# Patient Record
Sex: Female | Born: 1965
Health system: Southern US, Community
[De-identification: ages and names within clinical notes are randomized; demographics above are authoritative.]

## PROBLEM LIST (undated history)

## (undated) DIAGNOSIS — IMO0002 Reserved for concepts with insufficient information to code with codable children: Secondary | ICD-10-CM

## (undated) DIAGNOSIS — E785 Hyperlipidemia, unspecified: Secondary | ICD-10-CM

## (undated) DIAGNOSIS — I10 Essential (primary) hypertension: Secondary | ICD-10-CM

## (undated) DIAGNOSIS — R87619 Unspecified abnormal cytological findings in specimens from cervix uteri: Secondary | ICD-10-CM

## (undated) DIAGNOSIS — D649 Anemia, unspecified: Secondary | ICD-10-CM

## (undated) HISTORY — DX: Anemia, unspecified: D64.9

## (undated) HISTORY — DX: Essential (primary) hypertension: I10

## (undated) HISTORY — DX: Reserved for concepts with insufficient information to code with codable children: IMO0002

## (undated) HISTORY — DX: Hyperlipidemia, unspecified: E78.5

## (undated) HISTORY — PX: BREAST EXCISIONAL BIOPSY: SUR124

## (undated) HISTORY — DX: Unspecified abnormal cytological findings in specimens from cervix uteri: R87.619

---
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
## 2000-01-21 ENCOUNTER — Other Ambulatory Visit: Admission: RE | Admit: 2000-01-21 | Discharge: 2000-01-21 | Payer: Self-pay | Admitting: *Deleted

## 2003-05-02 ENCOUNTER — Ambulatory Visit (HOSPITAL_COMMUNITY): Admission: RE | Admit: 2003-05-02 | Discharge: 2003-05-02 | Payer: Self-pay

## 2003-05-31 ENCOUNTER — Encounter: Admission: RE | Admit: 2003-05-31 | Discharge: 2003-05-31 | Payer: Self-pay | Admitting: Internal Medicine

## 2004-12-09 ENCOUNTER — Ambulatory Visit: Payer: Self-pay | Admitting: Obstetrics and Gynecology

## 2004-12-09 ENCOUNTER — Other Ambulatory Visit: Admission: RE | Admit: 2004-12-09 | Discharge: 2004-12-09 | Payer: Self-pay | Admitting: Obstetrics and Gynecology

## 2004-12-26 ENCOUNTER — Ambulatory Visit: Payer: Self-pay | Admitting: Family Medicine

## 2005-08-20 ENCOUNTER — Ambulatory Visit: Payer: Self-pay | Admitting: Obstetrics and Gynecology

## 2005-08-20 ENCOUNTER — Encounter: Payer: Self-pay | Admitting: Obstetrics and Gynecology

## 2006-03-11 ENCOUNTER — Ambulatory Visit: Payer: Self-pay | Admitting: Obstetrics and Gynecology

## 2006-03-11 ENCOUNTER — Encounter: Payer: Self-pay | Admitting: Obstetrics and Gynecology

## 2006-09-25 ENCOUNTER — Emergency Department (HOSPITAL_COMMUNITY): Admission: EM | Admit: 2006-09-25 | Discharge: 2006-09-25 | Payer: Self-pay | Admitting: Family Medicine

## 2008-11-29 ENCOUNTER — Observation Stay (HOSPITAL_COMMUNITY): Admission: EM | Admit: 2008-11-29 | Discharge: 2008-11-29 | Payer: Self-pay | Admitting: Emergency Medicine
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
## 2010-11-04 NOTE — Op Note (Signed)
NAME:  Autumn Gomez, Autumn Gomez            ACCOUNT NO.:  [id number]   MEDICAL RECORD NO.:  [id number]          PATIENT TYPE:  OBV   LOCATION:  5037                         FACILITY:  [facility]   PHYSICIAN:  Artist Pais. Weingold, M.D.DATE OF BIRTH:  Oct 27, 1965   DATE OF PROCEDURE:  11/29/2008  DATE OF DISCHARGE:                               OPERATIVE REPORT   PREOPERATIVE DIAGNOSES:  Crush injury, left index and left long finger  with significant open injuries, distal interphalangeal level amputation  index finger, open distal interphalangeal fracture long finger.   POSTOPERATIVE DIAGNOSES:  Crush injury, left index and left long finger  with significant open injuries, distal interphalangeal level amputation  index finger, open distal interphalangeal fracture long finger.   PROCEDURE:  1. Irrigation and debridement of above with revision amputation and      volar V-Y flap with distal interphalangeal level amputation left      index finger.  2. Open treatment of distal interphalangeal fracture, nailbed      laceration, complex laceration left long finger.   SURGEON:  Artist Pais. Mina Marble, MD   ASSISTANT:  None.   ANESTHESIA:  General.   TOURNIQUET TIME:  38 minutes.   COMPLICATIONS:  No complications.   DRAINS:  No drains.   OPERATIVE REPORT:  The patient was taken to the operating suite.  After  induction of adequate general anesthesia, the left upper extremity was  prepped and draped in sterile fashion.  An Esmarch was used to  exsanguinate the limb, tourniquet was then inflated to 250 mmHg.  At  this point in time, the index finger and long finger were approached at  the distal aspects, left side.  There was a DIP level amputation of the  index finger with small remnant of distal interphalangeal bone still  attached to the profundus tendon.  Long finger had a complex open injury  to the distal phalanx with nailbed laceration, distal interphalangeal  fracture, etc.  The wounds were  thoroughly irrigated and debrided.  The  long finger was repaired using 4-0 Vicryl Rapide for the nailbed  distally and also the skin complex laceration and treatment of the open  distal phalangeal fracture.  Attention was then paid to the index finger  with a small remnant of distal interphalangeal bone was excised off the  head of the middle phalanx.  At this point in time, bilateral  neurectomies were performed and a volar V-Y flap was raised and advanced  over the tip of the middle phalanx covering the bone.  This was also  sutured with 4-0 Vicryl Rapide, 2 small dog ears were excised, the  Vicryl Rapide was also used to align the ends of the excised dog ears.  At this point in time, the wounds were  irrigated.  Plain Marcaine 0.25% was injected into the flexor sheath of  both the index and long finger, left side for postop pain control and  they were both placed in sterile dressing of Xeroform, 4x4s, and Coban  wraps.  The patient tolerated the procedure well and went to recovery in  stable fashion.  Artist Pais Mina Marble, M.D.  Electronically Signed     MAW/MEDQ  D:  11/29/2008  T:  11/29/2008  Job:  [id number]

## 2011-06-23 HISTORY — PX: OTHER SURGICAL HISTORY: SHX169

## 2011-10-20 ENCOUNTER — Other Ambulatory Visit: Payer: Self-pay | Admitting: Obstetrics and Gynecology

## 2011-10-20 ENCOUNTER — Encounter: Payer: Self-pay | Admitting: *Deleted

## 2011-10-20 ENCOUNTER — Ambulatory Visit (INDEPENDENT_AMBULATORY_CARE_PROVIDER_SITE_OTHER): Payer: Self-pay | Admitting: *Deleted

## 2011-10-20 VITALS — BP 146/91 | HR 64 | Temp 98.7°F | Ht 66.75 in | Wt 193.4 lb

## 2011-10-20 DIAGNOSIS — Z1239 Encounter for other screening for malignant neoplasm of breast: Secondary | ICD-10-CM

## 2011-10-20 DIAGNOSIS — N6322 Unspecified lump in the left breast, upper inner quadrant: Secondary | ICD-10-CM

## 2011-10-20 NOTE — Patient Instructions (Signed)
Taught patient how to perform BSE and gave educational materials to take home. Patient did not need a Pap smear today due to last Pap smear was in 08/17/11. Let her know BCCCP will cover Pap smears every 3 years unless has a history of abnormal Pap smears. Patient referred to the [facility] for diagnostic mammogram. Appointment scheduled for Oct 22, 2011 at 1400. Patient aware of appointment and will be there. Let patient know will follow up with her within the next couple weeks with results. Patient verbalized understanding.

## 2011-10-20 NOTE — Progress Notes (Signed)
Patient referred from the free breast and cervical cancer screening on 08/17/11 due to an area of concern on left breast. Last mammogram was in 2004.  Pap Smear:    Pap smear not performed today. Patients last Pap smear was 08/17/11 and normal. Patient has a history of two abnormal Pap smears that required colposcopies. Last abnormal Pap smear was in 2010 per patient. Pap smear result above is in EPIC under media.  Physical exam: Breasts Breasts symmetrical. No skin abnormalities right breast. Scar on left breast at area of concern from history of cyst removal. No nipple retraction bilateral breasts. No nipple discharge bilateral breasts. No lymphadenopathy. No lumps palpated right breast. Palpated thickened area at 11 o'clock left breast next to areola where had previous cyst removal. No complaints of pain or tenderness on exam. Patient referred to the [facility] for diagnostic mammogram. Appointment scheduled for Oct 22, 2011 at 1400.        Pelvic/Bimanual No Pap smear completed today since last Pap smear was 32/25/13 and normal. Pap smear not indicated per BCCCP guidelines.

## 2011-10-22 ENCOUNTER — Ambulatory Visit
Admission: RE | Admit: 2011-10-22 | Discharge: 2011-10-22 | Disposition: A | Discharge status: Home / Self Care | Payer: No Typology Code available for payment source | Source: Ambulatory Visit | Attending: Obstetrics and Gynecology | Admitting: Obstetrics and Gynecology

## 2011-10-22 DIAGNOSIS — N6322 Unspecified lump in the left breast, upper inner quadrant: Secondary | ICD-10-CM

## 2011-10-27 ENCOUNTER — Telehealth: Payer: Self-pay

## 2011-10-27 NOTE — Telephone Encounter (Signed)
Called pt and pt informed me that she was aware that she is to continue with annual mammograms and that the results were benign.  Pt stated that she was pleased about how everything was taken care of.  Pt had no further questions.

## 2012-04-18 ENCOUNTER — Emergency Department (INDEPENDENT_AMBULATORY_CARE_PROVIDER_SITE_OTHER)
Admission: EM | Admit: 2012-04-18 | Discharge: 2012-04-18 | Disposition: A | Discharge status: Home / Self Care | Payer: Self-pay | Source: Home / Self Care | Attending: Emergency Medicine | Admitting: Emergency Medicine

## 2012-04-18 ENCOUNTER — Encounter (HOSPITAL_COMMUNITY): Payer: Self-pay

## 2012-04-18 DIAGNOSIS — S139XXA Sprain of joints and ligaments of unspecified parts of neck, initial encounter: Secondary | ICD-10-CM

## 2012-04-18 DIAGNOSIS — S239XXA Sprain of unspecified parts of thorax, initial encounter: Secondary | ICD-10-CM

## 2012-04-18 DIAGNOSIS — S161XXA Strain of muscle, fascia and tendon at neck level, initial encounter: Secondary | ICD-10-CM

## 2012-04-18 MED ORDER — CYCLOBENZAPRINE HCL 10 MG PO TABS: 10.0000 mg | ORAL_TABLET | Freq: Two times a day (BID) | ORAL | Status: DC | PRN

## 2012-04-18 MED ORDER — NAPROXEN 500 MG PO TABS: 500.0000 mg | ORAL_TABLET | Freq: Two times a day (BID) | ORAL | Status: DC
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
## 2012-04-18 NOTE — ED Notes (Signed)
Patient complains of back pain between her shoulders and some cold sx, mostly sneezing  , all started Saturday

## 2012-04-19 NOTE — ED Provider Notes (Signed)
Medical screening examination/treatment/procedure(s) were performed by non-physician practitioner and as supervising physician I was immediately available for consultation/collaboration.  Leslee Home, M.D.   Reuben Likes, MD 04/19/12 1101

## 2012-09-27 ENCOUNTER — Other Ambulatory Visit: Payer: Self-pay

## 2013-01-26 ENCOUNTER — Emergency Department (HOSPITAL_COMMUNITY)
Admission: EM | Admit: 2013-01-26 | Discharge: 2013-01-26 | Disposition: A | Discharge status: Home / Self Care | Payer: BC Managed Care – PPO | Source: Home / Self Care | Attending: Emergency Medicine | Admitting: Emergency Medicine

## 2013-01-26 DIAGNOSIS — J020 Streptococcal pharyngitis: Secondary | ICD-10-CM

## 2013-01-26 LAB — POCT RAPID STREP A: Streptococcus, Group A Screen (Direct): POSITIVE — AB

## 2013-01-26 MED ORDER — PENICILLIN G BENZATHINE 1200000 UNIT/2ML IM SUSP
1.2000 10*6.[IU] | Freq: Once | INTRAMUSCULAR | Status: AC
  Administered 2013-01-26: 1.2 10*6.[IU] via INTRAMUSCULAR

## 2013-01-26 MED ORDER — PENICILLIN G BENZATHINE 1200000 UNIT/2ML IM SUSP
INTRAMUSCULAR | Status: AC
  Filled 2013-01-26: qty 2

## 2013-01-26 NOTE — ED Notes (Signed)
Patient aware of post injection delay prior to discharge.   

## 2013-01-26 NOTE — ED Notes (Signed)
Patient not ready for discharge 

## 2013-01-26 NOTE — ED Notes (Signed)
Offered ice chips and sprite.

## 2013-01-26 NOTE — ED Provider Notes (Signed)
Chief Complaint:  No chief complaint on file.   History of Present Illness:   Autumn Gomez is a 47 year old female who presents with a two-day history of sore throat on the right, temperature of up to 101.1, chills, headache, myalgias, and aching in her lymph nodes. She has been exposed to strep through a family member. She also works in childcare. She denies any nasal congestion, rhinorrhea, cough, or GI symptoms.  Review of Systems:  Other than as noted above, the patient denies any of the following symptoms. Systemic:  No fever, chills, sweats, fatigue, myalgias, headache, or anorexia. Eye:  No redness, pain or drainage. ENT:  No earache, ear congestion, nasal congestion, sneezing, rhinorrhea, sinus pressure, sinus pain, or post nasal drip. Lungs:  No cough, sputum production, wheezing, shortness of breath, or chest pain. GI:  No abdominal pain, nausea, vomiting, or diarrhea. Skin:  No rash or itching.  PMFSH:  Past medical history, family history, social history, meds, allergies, and nurse's notes were reviewed.  There is no known exposure to strep or mono.  No prior history of step or mono.  The patient denies use of tobacco. She is allergic to cephalexin. She is on her menses right now.  Physical Exam:   Vital signs:  BP 166/91  Pulse 95  Temp(Src) 101.1 F (38.4 C) (Oral)  Resp 20  SpO2 94% General:  Alert, in no distress. Eye:  No conjunctival injection or drainage. Lids were normal. ENT:  TMs and canals were normal, without erythema or inflammation.  Nasal mucosa was clear and uncongested, without drainage.  Mucous membranes were moist.  Exam of pharynx tonsils were enlarged, red, with spots of white exudate.  There were no oral ulcerations or lesions. Neck:  Supple, no adenopathy, tenderness or mass. Lungs:  No respiratory distress.  Lungs were clear to auscultation, without wheezes, rales or rhonchi.  Breath sounds were clear and equal bilaterally.  Heart:  Regular rhythm,  without gallops, murmers or rubs. Skin:  Clear, warm, and dry, without rash or lesions.  Labs:   Results for orders placed during the hospital encounter of 01/26/13  POCT RAPID STREP A ([facility] ONLY)      Result Value Range   Streptococcus, Group A Screen (Direct) POSITIVE (*) NEGATIVE    Course in Urgent Care Center:   Given LA Bicillin 1.2 million units IM.  Assessment:  The encounter diagnosis was Strep throat.  No evidence of peritonsillar abscess.  Plan:   1.  The following meds were prescribed:   Discharge Medication List as of 01/26/2013 12:56 PM     2.  The patient was instructed in symptomatic care including hot saline gargles, throat lozenges, infectious precautions, and need to trade out toothbrush. Handouts were given. 3.  The patient was told to return if becoming worse in any way, if no better in 3 or 4 days, and given some red flag symptoms such as difficulty swallowing or breathing that would indicate earlier return. 4.  Follow up here as needed.    Reuben Likes, MD 01/26/13 [phone]

## 2013-04-20 IMAGING — MG MM DIGITAL DIAGNOSTIC BILAT
5 series · 5 of 5 positions shown · non-contrast
Comparison: None.

CLINICAL DATA: Patient presents for a bilateral diagnostic
mammogram due to a palpable abnormality over the inner midportion
of the left breast.

DIGITAL DIAGNOSTIC BILATERAL MAMMOGRAM WITH CAD AND LEFT BREAST
ULTRASOUND:

[L CC (1 of 3)]
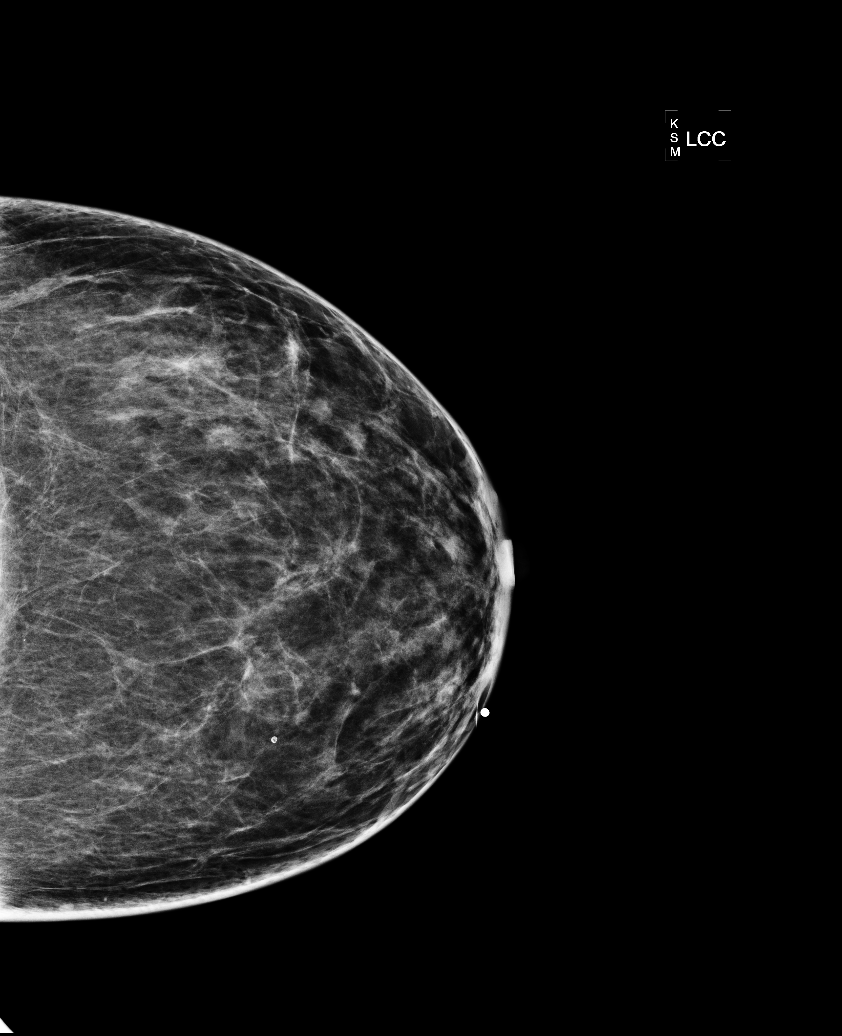

[L MLO]
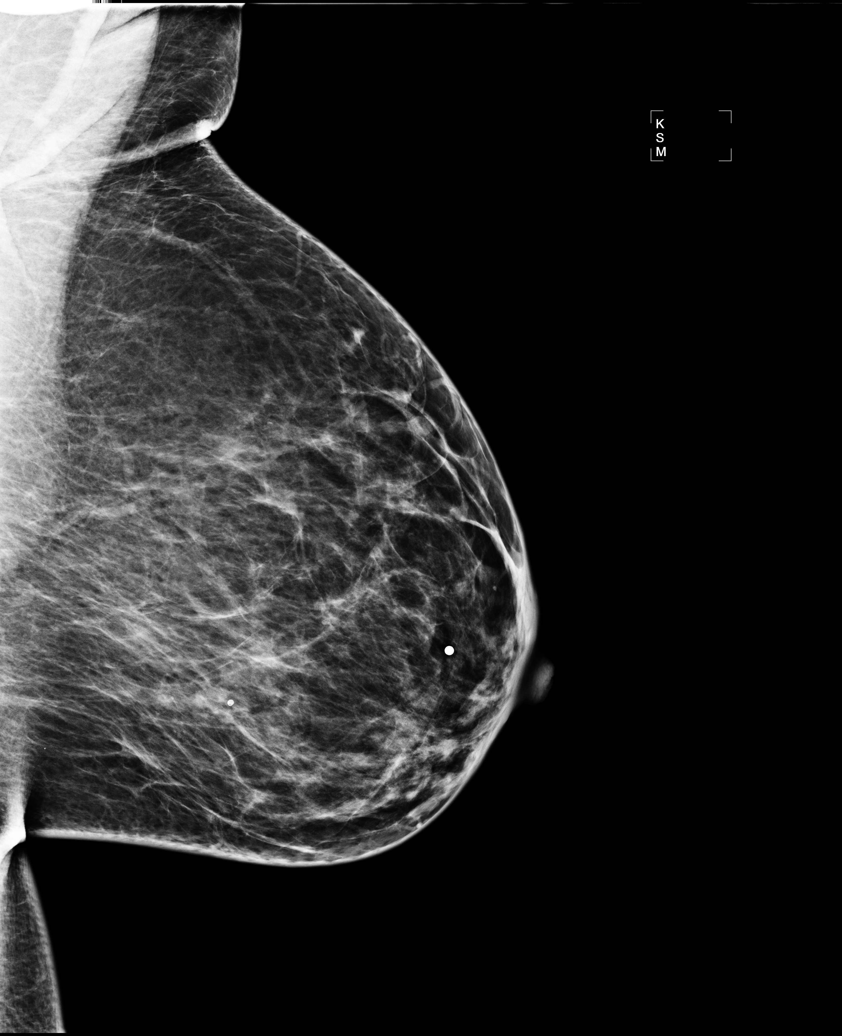

[R MLO]
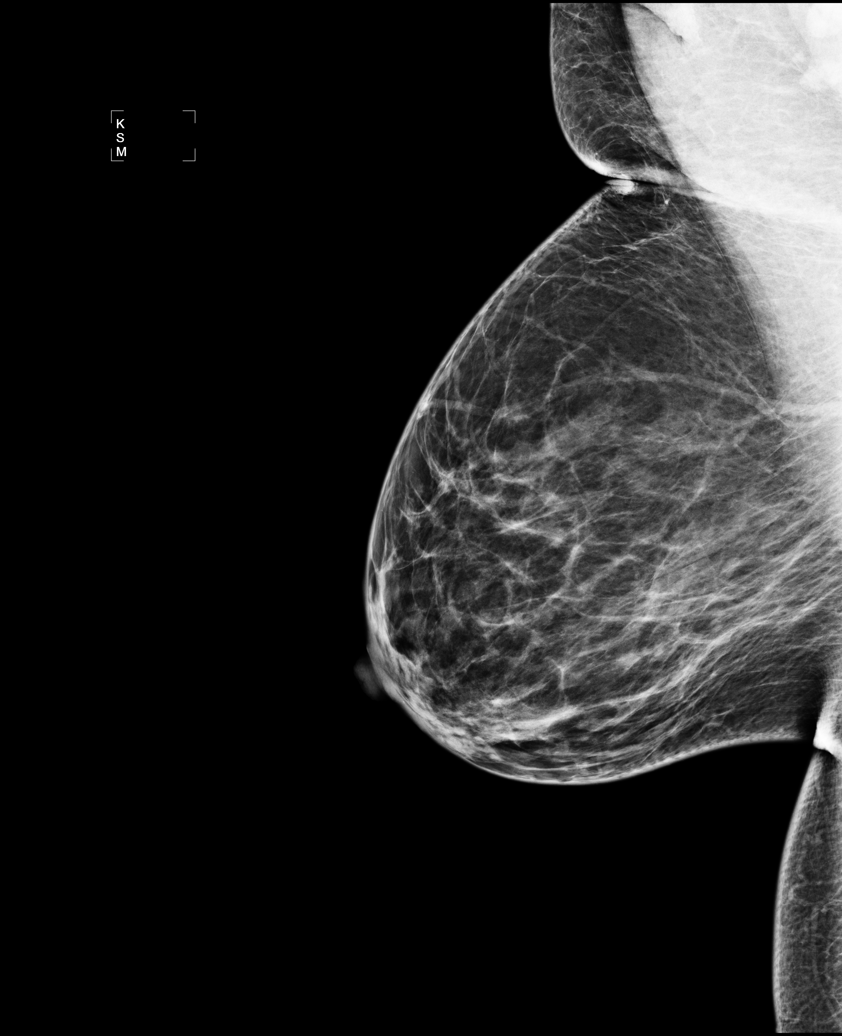

[L CC (2 of 3)]
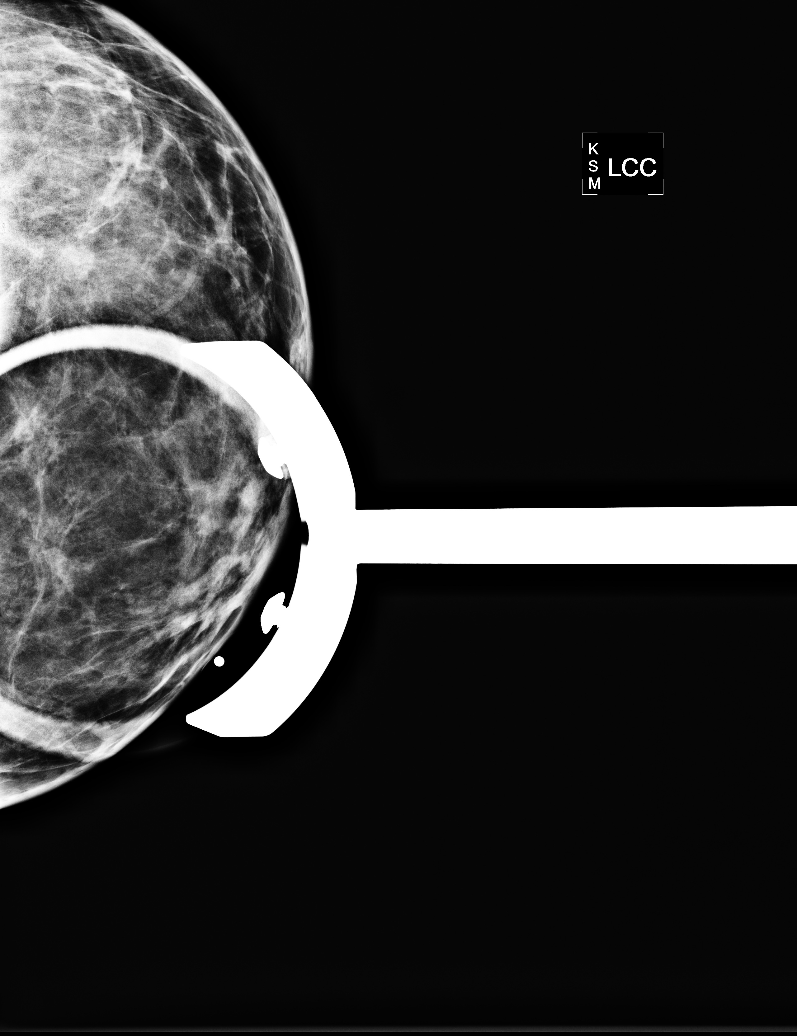

[L CC (3 of 3)]
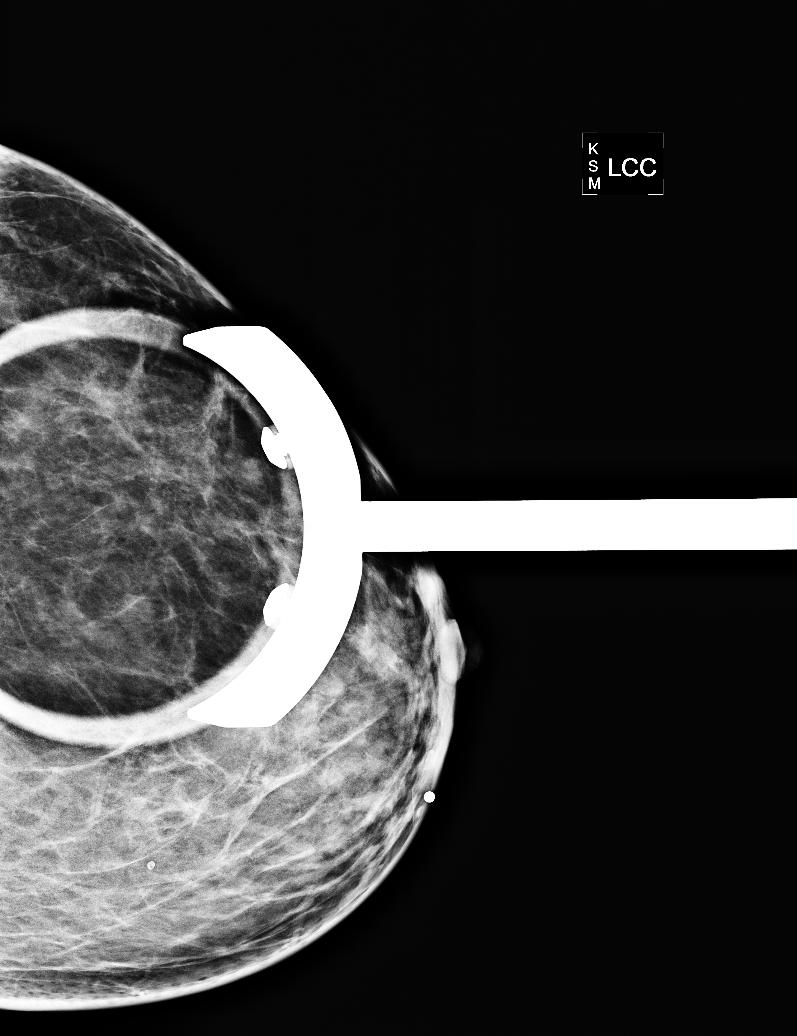

[5 of 5 positions shown; findings below may reference images not displayed]

FINDINGS: Examination demonstrates scattered fibroglandular
densities.  There is no focal abnormality over the inner midportion
of the left breast to correspond to patient's palpable abnormality.
There is a small focal density over the outer left breast which
does not persist upon additional imaging.  Remainder of the exam is
unremarkable.
Mammographic images were processed with CAD.

On physical exam, I palpate no definite focal abnormality of the
inner midportion of the left breast in the area of patient's
clinical abnormality.

Ultrasound is performed, showing no focal abnormality of the inner
midportion of the left breast over the area of patient's palpable
abnormality.

Ultrasound of the upper outer left breast demonstrates a
incidentally noted benign cyst cluster.
IMPRESSION: No focal abnormality over the inner midportion of the left breast
to correspond to patient's palpable abnormality.

Recommendations:  Recommend continued annual bilateral screening
mammographic follow-up.  Also recommend continued management of
patient's palpable abnormality on a clinical basis.

BI-RADS CATEGORY 2:  Benign finding(s).

## 2013-04-30 ENCOUNTER — Emergency Department (HOSPITAL_COMMUNITY): Payer: Self-pay

## 2013-04-30 ENCOUNTER — Encounter (HOSPITAL_COMMUNITY): Payer: Self-pay | Admitting: Emergency Medicine

## 2013-04-30 ENCOUNTER — Emergency Department (HOSPITAL_COMMUNITY)
Admission: EM | Admit: 2013-04-30 | Discharge: 2013-04-30 | Disposition: A | Discharge status: Home / Self Care | Payer: Self-pay | Attending: Emergency Medicine | Admitting: Emergency Medicine

## 2013-04-30 DIAGNOSIS — Y9301 Activity, walking, marching and hiking: Secondary | ICD-10-CM | POA: Insufficient documentation

## 2013-04-30 DIAGNOSIS — Y92410 Unspecified street and highway as the place of occurrence of the external cause: Secondary | ICD-10-CM | POA: Insufficient documentation

## 2013-04-30 DIAGNOSIS — Z862 Personal history of diseases of the blood and blood-forming organs and certain disorders involving the immune mechanism: Secondary | ICD-10-CM | POA: Insufficient documentation

## 2013-04-30 DIAGNOSIS — F17200 Nicotine dependence, unspecified, uncomplicated: Secondary | ICD-10-CM | POA: Insufficient documentation

## 2013-04-30 DIAGNOSIS — S9032XA Contusion of left foot, initial encounter: Secondary | ICD-10-CM

## 2013-04-30 DIAGNOSIS — Z79899 Other long term (current) drug therapy: Secondary | ICD-10-CM | POA: Insufficient documentation

## 2013-04-30 DIAGNOSIS — IMO0002 Reserved for concepts with insufficient information to code with codable children: Secondary | ICD-10-CM | POA: Insufficient documentation

## 2013-04-30 DIAGNOSIS — S9030XA Contusion of unspecified foot, initial encounter: Secondary | ICD-10-CM | POA: Insufficient documentation

## 2013-04-30 MED ORDER — IBUPROFEN 800 MG PO TABS: 800.0000 mg | ORAL_TABLET | Freq: Three times a day (TID) | ORAL | Status: DC | PRN
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
## 2013-04-30 NOTE — ED Provider Notes (Signed)
Medical screening examination/treatment/procedure(s) were performed by non-physician practitioner and as supervising physician I was immediately available for consultation/collaboration.  EKG Interpretation   None         Semya Klinke M Ariza Evans, MD 04/30/13 1404 

## 2013-08-23 ENCOUNTER — Telehealth (HOSPITAL_COMMUNITY): Payer: Self-pay | Admitting: *Deleted

## 2013-08-23 NOTE — Telephone Encounter (Signed)
Telephoned patient at home #. Patient is no longer eligible for the BCCCP program due to having insurance. Gave patient # to [place] to call and schedule appointment. Patient does not have primary care Dr. Patient voiced understanding.

## 2013-08-24 ENCOUNTER — Other Ambulatory Visit: Payer: Self-pay

## 2013-08-24 DIAGNOSIS — Z1231 Encounter for screening mammogram for malignant neoplasm of breast: Secondary | ICD-10-CM

## 2013-09-12 ENCOUNTER — Ambulatory Visit
Admission: RE | Admit: 2013-09-12 | Discharge: 2013-09-12 | Disposition: A | Discharge status: Home / Self Care | Payer: No Typology Code available for payment source | Source: Ambulatory Visit

## 2013-09-12 DIAGNOSIS — Z1231 Encounter for screening mammogram for malignant neoplasm of breast: Secondary | ICD-10-CM

## 2013-10-09 ENCOUNTER — Ambulatory Visit (INDEPENDENT_AMBULATORY_CARE_PROVIDER_SITE_OTHER): Payer: No Typology Code available for payment source | Admitting: Nurse Practitioner

## 2013-10-09 ENCOUNTER — Encounter: Payer: Self-pay | Admitting: Nurse Practitioner

## 2013-10-09 VITALS — BP 136/87 | HR 84 | Temp 98.0°F | Ht 65.0 in | Wt 213.8 lb

## 2013-10-09 DIAGNOSIS — D219 Benign neoplasm of connective and other soft tissue, unspecified: Secondary | ICD-10-CM

## 2013-10-09 DIAGNOSIS — F17200 Nicotine dependence, unspecified, uncomplicated: Secondary | ICD-10-CM | POA: Insufficient documentation

## 2013-10-09 DIAGNOSIS — D259 Leiomyoma of uterus, unspecified: Secondary | ICD-10-CM

## 2013-10-09 DIAGNOSIS — E669 Obesity, unspecified: Secondary | ICD-10-CM | POA: Insufficient documentation

## 2013-10-09 DIAGNOSIS — Z01419 Encounter for gynecological examination (general) (routine) without abnormal findings: Secondary | ICD-10-CM

## 2013-10-09 DIAGNOSIS — Z8742 Personal history of other diseases of the female genital tract: Secondary | ICD-10-CM | POA: Insufficient documentation

## 2013-10-09 NOTE — Progress Notes (Signed)
History:  Autumn Rutherfordis a 48 y.o. [id number] who presents to [facility] clinic today for pap smear and well woman exam. She has been having her paps with [facility] and is a little unsure of her results. She had her last pap in 2013. She has had 2 colposcopies in past and both were resulted as normal. She is currently not sexually active. Has been active mostly with women in past. No males in past 5 years. If she had sex with female she will use condoms. Her last mammogram was 2 weeks ago and she has never had an abnormal. Her menses are heavy the last few months. She has regular menses monthly but 3 days are heavy, 3 are medium and one minimal bleeding day.  The following portions of the patient's history were reviewed and updated as appropriate: allergies, current medications, past family history, past medical history, past social history, past surgical history and problem list.  Review of Systems:  Pertinent items are noted in HPI.  Objective:  Physical Exam BP 136/87  Pulse 84  Temp(Src) 98 F (36.7 C) (Oral)  Ht 5\' 5"  (1.651 m)  Wt 213 lb 12.8 oz (96.979 kg)  BMI 35.58 kg/m2  LMP 09/25/2013 GENERAL: Well-developed, well-nourished female in no acute distress. Obese HEENT: Normocephalic, atraumatic.  NECK: Supple. Normal thyroid.  LUNGS: Normal rate. Clear to auscultation bilaterally.  HEART: Regular rate and rhythm with no adventitious sounds.  BREASTS: Symmetric in size. No masses, skin changes, nipple drainage, or lymphadenopathy. Some skin issues under arms could be eczema ABDOMEN: Soft, nontender, nondistended. No organomegaly. Normal bowel sounds appreciated in all quadrants.  PELVIC: Normal external female genitalia. Vagina is pink and rugated.  Normal discharge. Normal cervix contour. Pap smear obtained. Uterus is full feeling and possible fibroid. No adnexal mass or tenderness.  EXTREMITIES: No cyanosis, clubbing, or edema, 2+ distal pulses.   Labs and Imaging Mm Digital Screening  Bilateral  09/13/2013   CLINICAL DATA:  Screening.  EXAM: DIGITAL SCREENING BILATERAL MAMMOGRAM WITH CAD  COMPARISON:  Previous exam(s).  ACR Breast Density Category b: There are scattered areas of fibroglandular density.  FINDINGS: There are no findings suspicious for malignancy. Images were processed with CAD.  IMPRESSION: No mammographic evidence of malignancy. A result letter of this screening mammogram will be mailed directly to the patient.  RECOMMENDATION: Screening mammogram in one year. (Code:SM-B-01Y)  BI-RADS CATEGORY  1: Negative.   Electronically Signed   By: Enrique Sack M.D.   On: 09/13/2013 11:19    Assessment & Plan:  Assessment:  Well Woman Exam Uterine Fibroids Obesity Smoker  Plans:  Pap today U/S for r/o fibroid Advised to loose weight and quit smoke  Olegario Messier, NP 10/09/2013 2:27 PM

## 2013-10-09 NOTE — Patient Instructions (Signed)
Fibroids Fibroids are lumps (tumors) that can occur any place in a woman's body. These lumps are not cancerous. Fibroids vary in size, weight, and where they grow. HOME CARE  Do not take aspirin.  Write down the number of pads or tampons you use during your period. Tell your doctor. This can help determine the best treatment for you. GET HELP RIGHT AWAY IF:  You have pain in your lower belly (abdomen) that is not helped with medicine.  You have cramps that are not helped with medicine.  You have more bleeding between or during your period.  You feel lightheaded or pass out (faint).  Your lower belly pain gets worse. MAKE SURE YOU:  Understand these instructions.  Will watch your condition.  Will get help right away if you are not doing well or get worse. Document Released: 07/11/2010 Document Revised: 08/31/2011 Document Reviewed: 07/11/2010 [employer] Patient Information 2014 [employer].  

## 2013-10-18 ENCOUNTER — Ambulatory Visit (HOSPITAL_COMMUNITY)
Admission: RE | Admit: 2013-10-18 | Discharge: 2013-10-18 | Disposition: A | Discharge status: Home / Self Care | Payer: No Typology Code available for payment source | Source: Ambulatory Visit | Attending: Nurse Practitioner | Admitting: Nurse Practitioner

## 2013-10-18 DIAGNOSIS — D252 Subserosal leiomyoma of uterus: Secondary | ICD-10-CM | POA: Insufficient documentation

## 2013-10-18 DIAGNOSIS — D251 Intramural leiomyoma of uterus: Secondary | ICD-10-CM | POA: Insufficient documentation

## 2013-10-18 DIAGNOSIS — N852 Hypertrophy of uterus: Secondary | ICD-10-CM | POA: Insufficient documentation

## 2013-10-18 DIAGNOSIS — D219 Benign neoplasm of connective and other soft tissue, unspecified: Secondary | ICD-10-CM

## 2013-10-23 ENCOUNTER — Telehealth: Payer: Self-pay | Admitting: *Deleted

## 2013-10-23 NOTE — Telephone Encounter (Signed)
Message copied by Sue Lush on Mon Oct 23, 2013  3:44 PM ------      Message from: BAREFOOT, LINDA M      Created: Mon Oct 23, 2013  1:06 PM       Can you let patient know that I reviewed Ultrasound with Dr Roselie Awkward and she has fibroids. Thanks, Vaughan Basta ------

## 2013-10-23 NOTE — Telephone Encounter (Signed)
Contacted patient, gave results, pt will schedule follow up appointment. No further questions.

## 2013-10-26 ENCOUNTER — Telehealth: Payer: Self-pay

## 2013-10-26 NOTE — Telephone Encounter (Signed)
Pt. Called stating she would like to know results/where her fibroid is located or if it will be discussed at follow up appointment. Spoke to Dr. Harolyn Wain regarding results-- multiple fibroids, possible discussion of surgery at follow up but nothing that needs to be done immediately. Called pt. And informed her that the ultrasound did show multiple fibroids but that the results will be discussed in detail at her follow up appointment and a plan of care will be developed. Pt. Asked if surgery would occur. Informed pt. That they may suggest surgery but that it would not need to happen right away. Pt. Stated she wants options. Informed pt. The providers will discuss options and results at follow up appointment. Pt. Verbalized understanding and gratitude. No further questions or concerns.

## 2013-12-06 ENCOUNTER — Ambulatory Visit (INDEPENDENT_AMBULATORY_CARE_PROVIDER_SITE_OTHER): Payer: No Typology Code available for payment source | Admitting: Obstetrics and Gynecology

## 2013-12-06 DIAGNOSIS — Z712 Person consulting for explanation of examination or test findings: Secondary | ICD-10-CM

## 2013-12-06 DIAGNOSIS — D259 Leiomyoma of uterus, unspecified: Secondary | ICD-10-CM

## 2013-12-06 DIAGNOSIS — Z7189 Other specified counseling: Secondary | ICD-10-CM

## 2013-12-06 NOTE — Progress Notes (Signed)
Patient ID: Autumn Gomez, female   DOB: Feb 01, 1966, 48 y.o.   MRN: [id number] 48 yo here for results of April pelvic ultrasound. Patient is doing well and is without complaints.   09/2013 ultrasound FINDINGS:  Uterus: 14.5 x 13.6 x 11.2 cm. Markedly heterogeneous uterine  echotexture. Well-defined lesion in the right side of the uterine  body measures 5.7 x 6.4 x 6.0 cm. A left-sided fundal lesion is  intramural versus subserosal and measures 6.7 x 4.1 x 4.6 cm.  Endometrium: Poorly evaluated secondary to the extent of presumed  fibroids. Measures maximally 12 mm.  Right Ovary: 4.6 x 2.9 x 3.0 cm. Dominant follicle within.  Hyperechoic deep right ovarian lesion is suspicious for a dermoid.  2.2 x 1.3 x 1.9 cm, including on image 41.  A heterogeneous "Mass" positioned medial and posterior to the right  ovary. Measure 6.4 x 5.6 x 4.6 cm. Example image 54.  Left Ovary: Not visualized.  Other Findings: Small volume free pelvic fluid. There is right-sided  adnexal fluid. Localized adnexal fluid versus hydrosalpinx within  the left hemipelvis. Example image 46.  IMPRESSION:  1. Markedly heterogeneous uterine echotexture and enlargement.  Presumably related to uterine fibroids.  2. Hyperechoic right ovarian lesion, likely a dermoid.  3. "Mass" medial to the right ovary, which could represent an  exophytic right sided uterine fibroid. Suboptimally evaluated.  4. Localize left adnexal fluid versus hydrosalpinx. Pre and post  contrast pelvic MRI may be useful for further characterization of  the multiple above findings.  Electronically Signed  By: Abigail Miyamoto M.D.  On: 10/18/2013 10:34  A/P 48 yo with fibroid uterus - Results were reviewed with the patient - No intervention needed at this time as patient is asymptomatic and patient is not interested in treatment options either - RTC in April for annual exam or prn

## 2014-11-05 ENCOUNTER — Ambulatory Visit: Payer: No Typology Code available for payment source | Admitting: Family Medicine

## 2014-11-09 ENCOUNTER — Other Ambulatory Visit: Payer: Self-pay | Admitting: Obstetrics and Gynecology

## 2014-11-09 DIAGNOSIS — Z1231 Encounter for screening mammogram for malignant neoplasm of breast: Secondary | ICD-10-CM

## 2014-11-29 ENCOUNTER — Ambulatory Visit (HOSPITAL_COMMUNITY)
Admission: RE | Admit: 2014-11-29 | Discharge: 2014-11-29 | Disposition: A | Discharge status: Home / Self Care | Payer: Self-pay | Source: Ambulatory Visit | Attending: Obstetrics and Gynecology | Admitting: Obstetrics and Gynecology

## 2014-11-29 ENCOUNTER — Encounter (HOSPITAL_COMMUNITY): Payer: Self-pay

## 2014-11-29 VITALS — BP 164/100 | Temp 98.7°F | Ht 65.5 in | Wt 198.0 lb

## 2014-11-29 DIAGNOSIS — Z1239 Encounter for other screening for malignant neoplasm of breast: Secondary | ICD-10-CM

## 2014-11-29 DIAGNOSIS — Z1231 Encounter for screening mammogram for malignant neoplasm of breast: Secondary | ICD-10-CM

## 2014-11-29 NOTE — Progress Notes (Signed)
CLINIC:  [facility] Clinic  REASON FOR VISIT: Well-woman exam and screening mammogram.    HISTORY OF PRESENT ILLNESS:  Autumn Gomez is a 49 y.o. female who presents to the [facility] today for clinical breast exam. She has a history of benign breast biopsies x 2 at age 32 and age 34 in the left breast.  Both biopsies were at the same 10 o'clock location of the left breast and revealed cysts.  She later went on to have the cysts excised, as they could not drained, per patient report.  Her family history of breast cancer includes a maternal aunt. No other family history of breast cancer.  She has no complaints today.  Her last pap smear with HPV co-testing was in in 09/2013 and were both negative. She has a history of abnormal pap smears in the past requiring 2 colposcopies. Last recorded abnormal pap was 2010.    REVIEW OF SYSTEMS:  Denies any breast pain, nodularity, nipple inversion, or nipple discharge bilaterally.   ALLERGIES: Allergies  Allergen Reactions  . Keflex [Cephalexin]     CURRENT MEDICATIONS:  Current Outpatient Prescriptions on File Prior to Encounter  Medication Sig Dispense Refill  . cyclobenzaprine (FLEXERIL) 10 MG tablet Take 1 tablet (10 mg total) by mouth 2 (two) times daily as needed for muscle spasms. (Patient not taking: Reported on 11/29/2014) 20 tablet 0  . ibuprofen (ADVIL,MOTRIN) 800 MG tablet Take 1 tablet (800 mg total) by mouth every 8 (eight) hours as needed. (Patient not taking: Reported on 11/29/2014) 21 tablet 0   No current facility-administered medications on file prior to encounter.     SOCIAL HISTORY:  Autumn Gomez is single and does not have any children.  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     PHYSICAL EXAM:  Vitals:  Filed Vitals:   11/29/14 1016  BP: 164/100  Temp: 98.7 F (37.1 C)   General: Well-nourished,  well-appearing female in no acute distress.  She is unaccompanied in clinic today.  Rolena Infante, LPN was present during physical exam for this patient.  Breasts: Bilateral breasts exposed and observed with patient standing (arms at side, arms on hips, arms on hips flexed forward, and arms over head).  Left breast healed scar noted at 10 o'clock position, abutting the nipple. No other gross abnormalities including breast skin puckering or dimpling noted on observation.  Left breast is larger than right.  Breast skin is without evidence of skin redness, thickening, or peau d'orange appearance. No nipple retraction or nipple discharge noted bilaterally.  No breast nodularity palpated in bilateral breasts.  Normal fibrocystic breast changes noted in bilateral breasts. Axillary lymph nodes: No axillary lymphadenopathy bilaterally.   GU: Exam deferred. Pap smear is up-to-date.  ASSESSMENT & PLAN:   1. Breast cancer screening: Autumn Gomez has no palpable breast abnormalities on her clinical breast exam today.  She will receive her screening mammogram as scheduled.  She will be contracted by the imaging center for results of the mammogram, either by letter or phone within the next few weeks.  She was given instructions and educational materials regarding breast self-awareness. Autumn Gomez is aware of this plan and agrees with it.    Autumn Gomez was encouraged to ask questions and all questions were answered to her satisfaction.    Mike Craze, NP [place]  [phone]

## 2014-12-12 ENCOUNTER — Encounter: Payer: Self-pay | Admitting: Family Medicine

## 2014-12-12 ENCOUNTER — Ambulatory Visit: Payer: Self-pay | Attending: Family Medicine | Admitting: Family Medicine

## 2014-12-12 VITALS — BP 159/97 | HR 77 | Temp 99.2°F | Resp 16 | Ht 65.25 in | Wt 199.0 lb

## 2014-12-12 DIAGNOSIS — I10 Essential (primary) hypertension: Secondary | ICD-10-CM | POA: Insufficient documentation

## 2014-12-12 DIAGNOSIS — Z114 Encounter for screening for human immunodeficiency virus [HIV]: Secondary | ICD-10-CM | POA: Insufficient documentation

## 2014-12-12 DIAGNOSIS — D649 Anemia, unspecified: Secondary | ICD-10-CM

## 2014-12-12 DIAGNOSIS — B354 Tinea corporis: Secondary | ICD-10-CM | POA: Insufficient documentation

## 2014-12-12 DIAGNOSIS — J302 Other seasonal allergic rhinitis: Secondary | ICD-10-CM

## 2014-12-12 DIAGNOSIS — E559 Vitamin D deficiency, unspecified: Secondary | ICD-10-CM

## 2014-12-12 DIAGNOSIS — Z23 Encounter for immunization: Secondary | ICD-10-CM

## 2014-12-12 DIAGNOSIS — J309 Allergic rhinitis, unspecified: Secondary | ICD-10-CM | POA: Insufficient documentation

## 2014-12-12 DIAGNOSIS — Z0000 Encounter for general adult medical examination without abnormal findings: Secondary | ICD-10-CM

## 2014-12-12 LAB — COMPLETE METABOLIC PANEL WITH GFR
ALT: 15 U/L (ref 0–35)
AST: 25 U/L (ref 0–37)
Albumin: 4.1 g/dL (ref 3.5–5.2)
Alkaline Phosphatase: 56 U/L (ref 39–117)
BUN: 14 mg/dL (ref 6–23)
CO2: 25 mEq/L (ref 19–32)
Calcium: 9.2 mg/dL (ref 8.4–10.5)
Chloride: 106 mEq/L (ref 96–112)
Creat: 0.93 mg/dL (ref 0.50–1.10)
GFR, Est African American: 84 mL/min
GFR, Est Non African American: 73 mL/min
Glucose, Bld: 91 mg/dL (ref 70–99)
Potassium: 4.5 mEq/L (ref 3.5–5.3)
Sodium: 139 mEq/L (ref 135–145)
Total Bilirubin: 0.3 mg/dL (ref 0.2–1.2)
Total Protein: 6.8 g/dL (ref 6.0–8.3)

## 2014-12-12 LAB — CBC
HCT: 31.4 % — ABNORMAL LOW (ref 36.0–46.0)
Hemoglobin: 10.1 g/dL — ABNORMAL LOW (ref 12.0–15.0)
MCH: 28.9 pg (ref 26.0–34.0)
MCHC: 32.2 g/dL (ref 30.0–36.0)
MCV: 90 fL (ref 78.0–100.0)
MPV: 12.3 fL (ref 8.6–12.4)
Platelets: 304 10*3/uL (ref 150–400)
RBC: 3.49 MIL/uL — ABNORMAL LOW (ref 3.87–5.11)
RDW: 15.3 % (ref 11.5–15.5)
WBC: 7.6 10*3/uL (ref 4.0–10.5)

## 2014-12-12 LAB — LIPID PANEL
Cholesterol: 199 mg/dL (ref 0–200)
HDL: 74 mg/dL (ref >=46)
LDL Cholesterol: 99 mg/dL (ref 0–99)
Total CHOL/HDL Ratio: 2.7 Ratio
Triglycerides: 130 mg/dL (ref <150)
VLDL: 26 mg/dL (ref 0–40)

## 2014-12-12 MED ORDER — CETIRIZINE HCL 10 MG PO TABS: 10.0000 mg | ORAL_TABLET | Freq: Every day | ORAL | Status: DC

## 2014-12-12 MED ORDER — KETOCONAZOLE 2 % EX CREA: 1.0000 "application " | TOPICAL_CREAM | Freq: Every day | CUTANEOUS | Status: DC

## 2014-12-12 NOTE — Progress Notes (Signed)
   Subjective:    Patient ID: Autumn Gomez, female    DOB: February 05, 1966, 49 y.o.   MRN: [id number] CC:  Establish care, elevated BP  HPI  1. Hypertension Patient is here for evaluation of elevated blood pressures. Age at onset of elevated blood pressure:  45. Cardiac symptoms: palpitations. Patient denies chest pain, dyspnea and lower extremity edema. Cardiovascular risk factors: none. Use of agents associated with hypertension: decongestants and NSAIDS. History of target organ damage: none.  2. Skin rash: R upper abdomen. Mild itching. Gets sweaty with work. Had similar rash in R upper chest that resolved.   3. Allergies: taking OTC decongestant and nasal steroid, gets sinus pressure, sneezingm runny nose.  Med Hx: anemia Surg Hx: L finger amputation Fam Hx: HTN in mother and father   Review of Systems  Constitutional: Negative for fever, chills and unexpected weight change.  HENT: Positive for sinus pressure and sneezing.   Eyes: Positive for visual disturbance.  Respiratory: Positive for cough. Negative for chest tightness and shortness of breath.   Cardiovascular: Positive for palpitations. Negative for chest pain and leg swelling.  Musculoskeletal: Positive for neck pain.  Skin: Positive for rash.       R upper abdomen, red splotch   Allergic/Immunologic: Positive for environmental allergies. Negative for food allergies and immunocompromised state.  Neurological: Positive for headaches. Negative for dizziness, tremors, weakness and light-headedness.  Psychiatric/Behavioral: Negative for dysphoric mood. The patient is not nervous/anxious.       Objective:   Physical Exam BP 159/97 mmHg  Pulse 77  Temp(Src) 99.2 F (37.3 C) (Oral)  Resp 16  Ht 5' 5.25" (1.657 m)  Wt 199 lb (90.266 kg)  BMI 32.88 kg/m2  SpO2 100%  LMP 11/22/2014 (Exact Date)  BP Readings from Last 3 Encounters:  11/29/14 164/100  10/09/13 136/87  04/30/13 139/99  General appearance: alert,  cooperative and no distress Head: Normocephalic, without obvious abnormality, atraumatic Eyes: conjunctivae/corneas clear. PERRL, EOM's intact.  Nose; swollen pink nasal turbinates b/l  Neck: no adenopathy, supple, symmetrical, trachea midline and thyroid not enlarged, symmetric, no tenderness/mass/nodules Lungs: clear to auscultation bilaterally Heart: regular rate and rhythm, S1, S2 normal, no murmur, click, rub or gallop Abdomen: soft, non-tender; bowel sounds normal; no masses,  no organomegaly Extremities: extremities normal, atraumatic, no cyanosis or edema Pulses: 2+ and symmetric Skin: Skin color, texture, turgor normal. No rashes or lesions except semicircular hyperpigmented ring on R upper abdomen  Neurologic: Grossly normal     Assessment & Plan:

## 2014-12-12 NOTE — Assessment & Plan Note (Signed)
1. Hypertension: Goal BP < 140/90  How to get to goal: Regular exercise  Low salt diet Medication-thiazide diuretics, hydrochlorothiazide or chlorthalidone

## 2014-12-12 NOTE — Assessment & Plan Note (Signed)
Allergies: zyrtec 10 mg once daily.

## 2014-12-12 NOTE — Progress Notes (Signed)
Establish Care Stated was told had elevated BP 174/120 No Hx tobacco

## 2014-12-12 NOTE — Assessment & Plan Note (Signed)
   Skin rash: superficial fungus, change shirt to stay dry. Ketoconazole cream.

## 2014-12-12 NOTE — Assessment & Plan Note (Addendum)
Screening HIV ordered  Screening HIV negative  

## 2014-12-12 NOTE — Patient Instructions (Addendum)
Autumn Gomez,  Thank you for coming in today. It was a pleasure meeting you. I look forward to being your primary doctor.  1. Hypertension: Goal BP < 140/90  How to get to goal: Regular exercise  Low salt diet Medication-thiazide diuretics, hydrochlorothiazide or chlorthalidone   2. Skin rash: superficial fungus, change shirt to stay dry. Ketoconazole cream.  3. Allergies: zyrtec 10 mg once daily.   F/u in 3-4 weeks with me for hypertension   Dr. Adrian Blackwater   DASH Eating Plan DASH stands for "Dietary Approaches to Stop Hypertension." The DASH eating plan is a healthy eating plan that has been shown to reduce high blood pressure (hypertension). Additional health benefits may include reducing the risk of type 2 diabetes mellitus, heart disease, and stroke. The DASH eating plan may also help with weight loss. WHAT DO I NEED TO KNOW ABOUT THE DASH EATING PLAN? For the DASH eating plan, you will follow these general guidelines:  Choose foods with a percent daily value for sodium of less than 5% (as listed on the food label).  Use salt-free seasonings or herbs instead of table salt or sea salt.  Check with your health care provider or pharmacist before using salt substitutes.  Eat lower-sodium products, often labeled as "lower sodium" or "no salt added."  Eat fresh foods.  Eat more vegetables, fruits, and low-fat dairy products.  Choose whole grains. Look for the word "whole" as the first word in the ingredient list.  Choose fish and skinless chicken or [place] more often than red meat. Limit fish, poultry, and meat to 6 oz (170 g) each day.  Limit sweets, desserts, sugars, and sugary drinks.  Choose heart-healthy fats.  Limit cheese to 1 oz (28 g) per day.  Eat more home-cooked food and less restaurant, buffet, and fast food.  Limit fried foods.  Cook foods using methods other than frying.  Limit canned vegetables. If you do use them, rinse them well to decrease the  sodium.  When eating at a restaurant, ask that your food be prepared with less salt, or no salt if possible. WHAT FOODS CAN I EAT? Seek help from a dietitian for individual calorie needs. Grains Whole grain or whole wheat bread. Brown rice. Whole grain or whole wheat pasta. Quinoa, bulgur, and whole grain cereals. Low-sodium cereals. Corn or whole wheat flour tortillas. Whole grain cornbread. Whole grain crackers. Low-sodium crackers. Vegetables Fresh or frozen vegetables (raw, steamed, roasted, or grilled). Low-sodium or reduced-sodium tomato and vegetable juices. Low-sodium or reduced-sodium tomato sauce and paste. Low-sodium or reduced-sodium canned vegetables.  Fruits All fresh, canned (in natural juice), or frozen fruits. Meat and Other Protein Products Ground beef (85% or leaner), grass-fed beef, or beef trimmed of fat. Skinless chicken or [place]. Ground chicken or [place]. Pork trimmed of fat. All fish and seafood. Eggs. Dried beans, peas, or lentils. Unsalted nuts and seeds. Unsalted canned beans. Dairy Low-fat dairy products, such as skim or 1% milk, 2% or reduced-fat cheeses, low-fat ricotta or cottage cheese, or plain low-fat yogurt. Low-sodium or reduced-sodium cheeses. Fats and Oils Tub margarines without trans fats. Light or reduced-fat mayonnaise and salad dressings (reduced sodium). Avocado. Safflower, olive, or canola oils. Natural peanut or almond butter. Other Unsalted popcorn and pretzels. The items listed above may not be a complete list of recommended foods or beverages. Contact your dietitian for more options. WHAT FOODS ARE NOT RECOMMENDED? Grains White bread. White pasta. White rice. Refined cornbread. Bagels and croissants. Crackers that contain trans  fat. Vegetables Creamed or fried vegetables. Vegetables in a cheese sauce. Regular canned vegetables. Regular canned tomato sauce and paste. Regular tomato and vegetable juices. Fruits Dried fruits. Canned fruit in  light or heavy syrup. Fruit juice. Meat and Other Protein Products Fatty cuts of meat. Ribs, chicken wings, bacon, sausage, bologna, salami, chitterlings, fatback, hot dogs, bratwurst, and packaged luncheon meats. Salted nuts and seeds. Canned beans with salt. Dairy Whole or 2% milk, cream, half-and-half, and cream cheese. Whole-fat or sweetened yogurt. Full-fat cheeses or blue cheese. Nondairy creamers and whipped toppings. Processed cheese, cheese spreads, or cheese curds. Condiments Onion and garlic salt, seasoned salt, table salt, and sea salt. Canned and packaged gravies. Worcestershire sauce. Tartar sauce. Barbecue sauce. Teriyaki sauce. Soy sauce, including reduced sodium. Steak sauce. Fish sauce. Oyster sauce. Cocktail sauce. Horseradish. Ketchup and mustard. Meat flavorings and tenderizers. Bouillon cubes. Hot sauce. Tabasco sauce. Marinades. Taco seasonings. Relishes. Fats and Oils Butter, stick margarine, lard, shortening, ghee, and bacon fat. Coconut, palm kernel, or palm oils. Regular salad dressings. Other Pickles and olives. Salted popcorn and pretzels. The items listed above may not be a complete list of foods and beverages to avoid. Contact your dietitian for more information. WHERE CAN I FIND MORE INFORMATION? National Heart, Lung, and Blood Institute: travelstabloid.com Document Released: 05/28/2011 Document Revised: 10/23/2013 Document Reviewed: 04/12/2013 [facility] Patient Information 2015 [place], [place]. This information is not intended to replace advice given to you by your health care provider. Make sure you discuss any questions you have with your health care provider.

## 2014-12-13 LAB — HIV ANTIBODY (ROUTINE TESTING W REFLEX): HIV 1&2 Ab, 4th Generation: NONREACTIVE

## 2014-12-13 LAB — VITAMIN D 25 HYDROXY (VIT D DEFICIENCY, FRACTURES): Vit D, 25-Hydroxy: 7 ng/mL — ABNORMAL LOW (ref 30–100)

## 2014-12-17 DIAGNOSIS — Z0000 Encounter for general adult medical examination without abnormal findings: Secondary | ICD-10-CM | POA: Insufficient documentation

## 2014-12-17 DIAGNOSIS — D649 Anemia, unspecified: Secondary | ICD-10-CM | POA: Insufficient documentation

## 2014-12-17 DIAGNOSIS — E559 Vitamin D deficiency, unspecified: Secondary | ICD-10-CM | POA: Insufficient documentation

## 2014-12-17 MED ORDER — VITAMIN D3 50 MCG (2000 UT) PO TABS: 2000.0000 [IU] | ORAL_TABLET | Freq: Every day | ORAL | Status: DC

## 2014-12-17 MED ORDER — VITAMIN D (ERGOCALCIFEROL) 1.25 MG (50000 UNIT) PO CAPS: 50000.0000 [IU] | ORAL_CAPSULE | ORAL | Status: DC

## 2014-12-17 NOTE — Assessment & Plan Note (Signed)
A: vit D def P: 50 K U D3 x 12 weeks Followed by 2 K U D3 daily

## 2014-12-17 NOTE — Addendum Note (Signed)
Addended by: Boykin Nearing on: 12/17/2014 02:21 PM   Modules accepted: Orders

## 2014-12-25 ENCOUNTER — Telehealth: Payer: Self-pay | Admitting: *Deleted

## 2014-12-25 NOTE — Telephone Encounter (Signed)
LVM to return call.

## 2014-12-25 NOTE — Telephone Encounter (Signed)
-----   Message from Boykin Nearing, MD sent at 12/17/2014  2:19 PM EDT ----- Mild anemia, GI referral for screening colonoscopy  Vit D deficiency will treat Screening HIV negative CMP normal

## 2014-12-26 ENCOUNTER — Encounter: Payer: Self-pay | Admitting: *Deleted

## 2014-12-26 NOTE — Telephone Encounter (Signed)
Unable to contact Pt  Voice mail full

## 2015-01-09 NOTE — Telephone Encounter (Signed)
Telephoned patient at home # and left message to return call.

## 2015-04-16 IMAGING — US US PELVIS COMPLETE
1 series · 13 of 25 positions shown · non-contrast
Comparison: None

CLINICAL DATA: Enlarged uterus.  Uterine fibroids.

EXAM:
TRANSABDOMINAL AND TRANSVAGINAL ULTRASOUND OF PELVIS
TECHNIQUE: Both transabdominal and transvaginal ultrasound examinations of the
pelvis were performed. Transabdominal technique was performed for
global imaging of the pelvis including uterus, ovaries, adnexal
regions, and pelvic cul-de-sac. It was necessary to proceed with
endovaginal exam following the transabdominal exam to visualize the
uterus, ovaries, and adnexa.

[Series 1: us pelvis complete · 54 acquisitions, 13 frames shown]
[im 1/54]
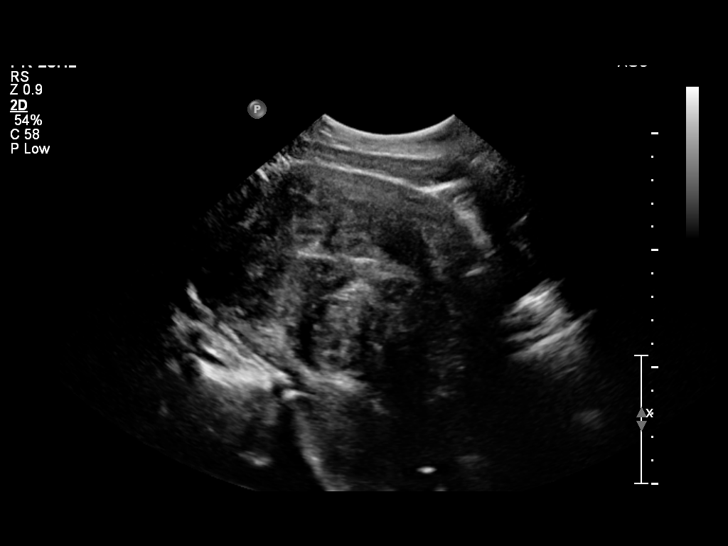
[im 5/54]
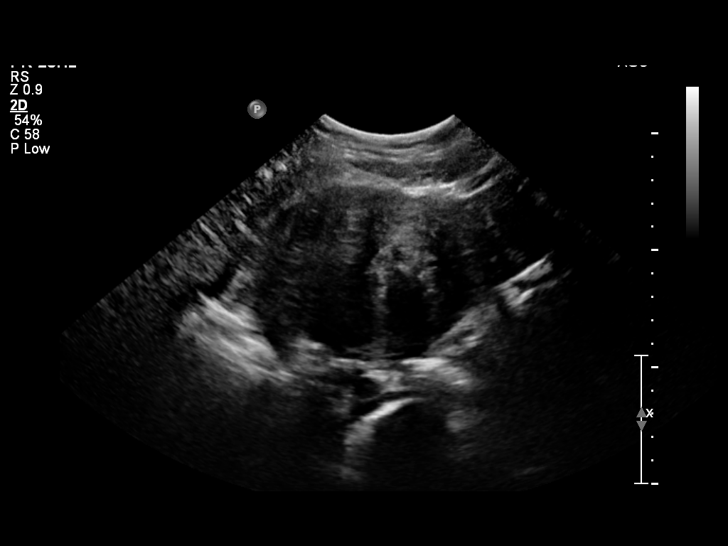
[im 9/54]
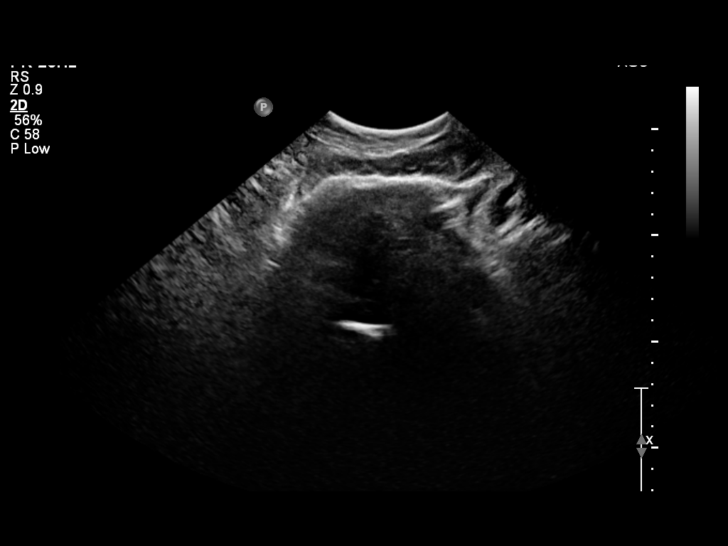
[im 14/54]
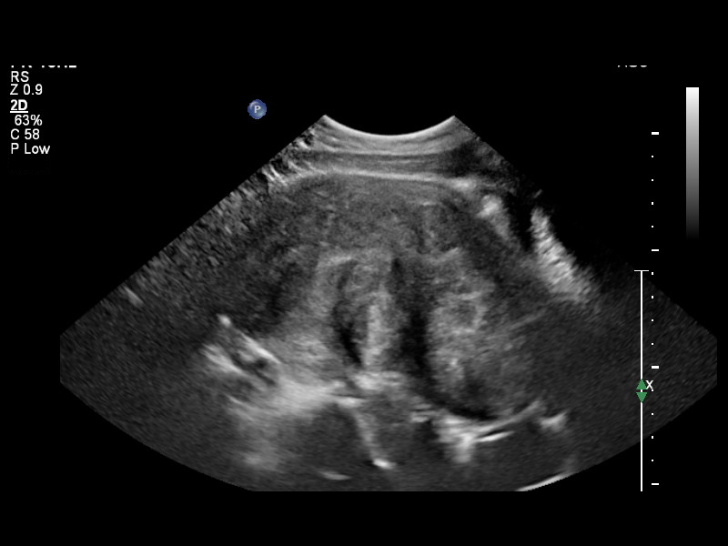
[im 18/54]
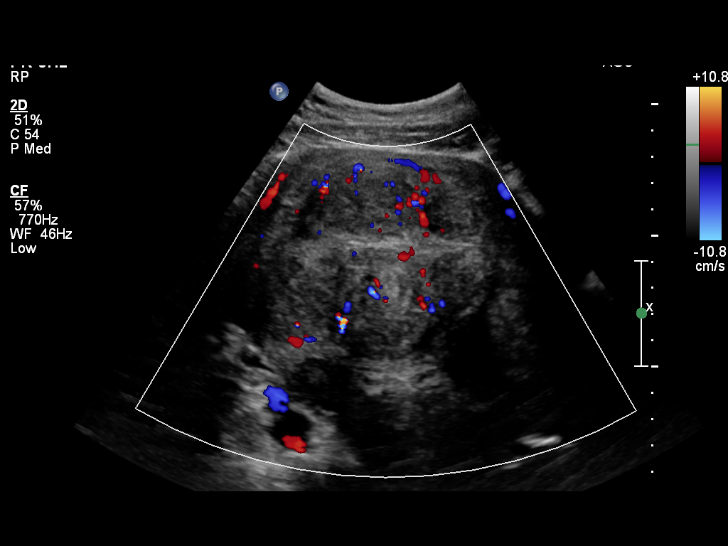
[im 23/54]
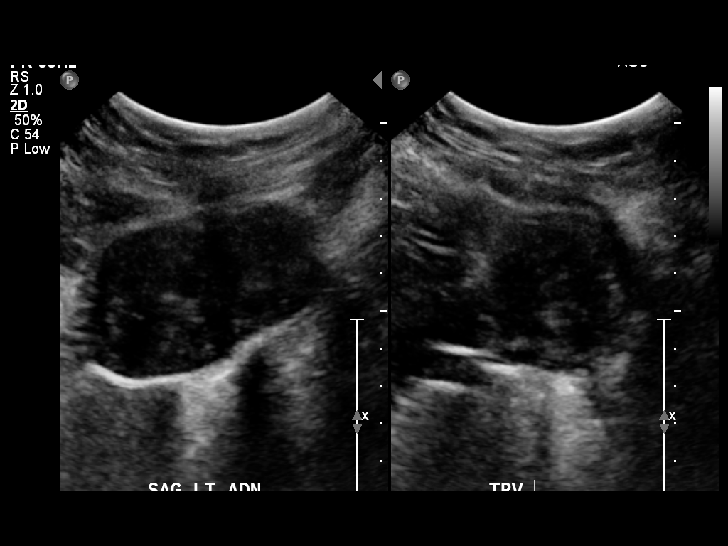
[im 27/54]
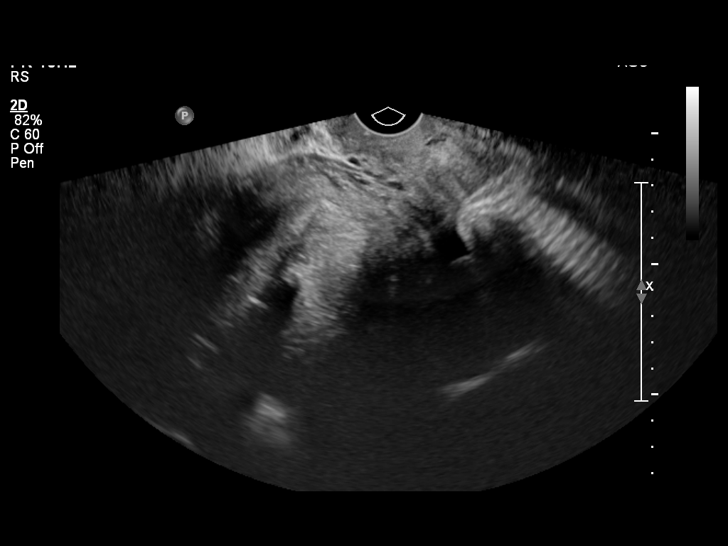
[im 31/54]
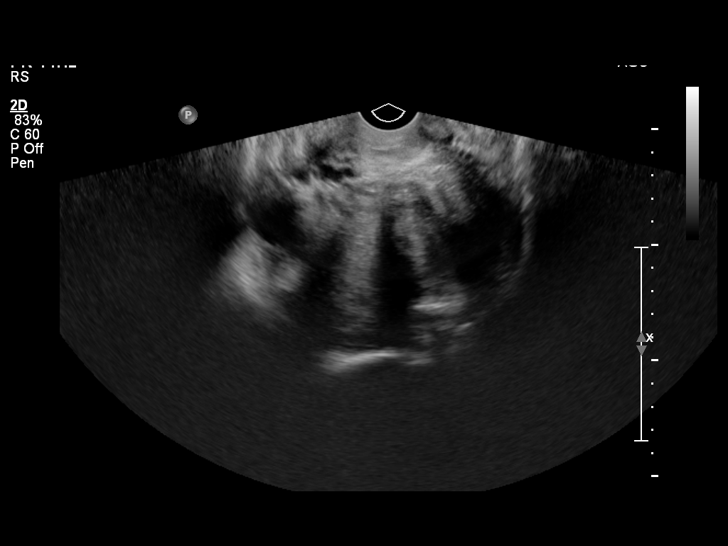
[im 36/54]
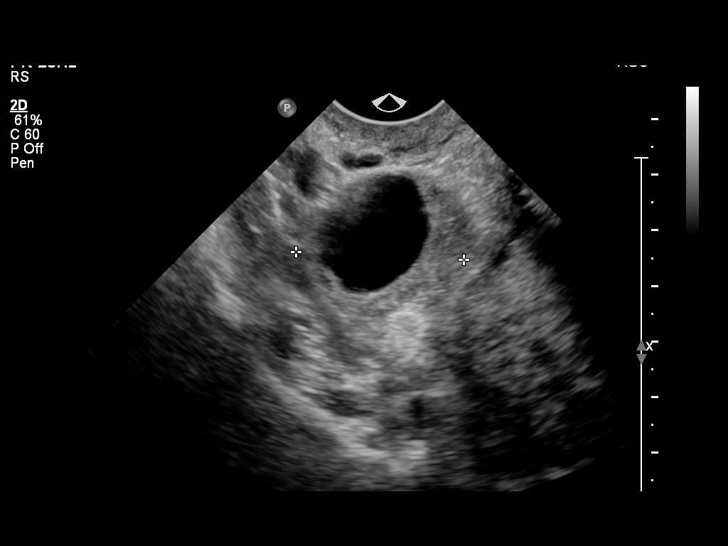
[im 40/54]
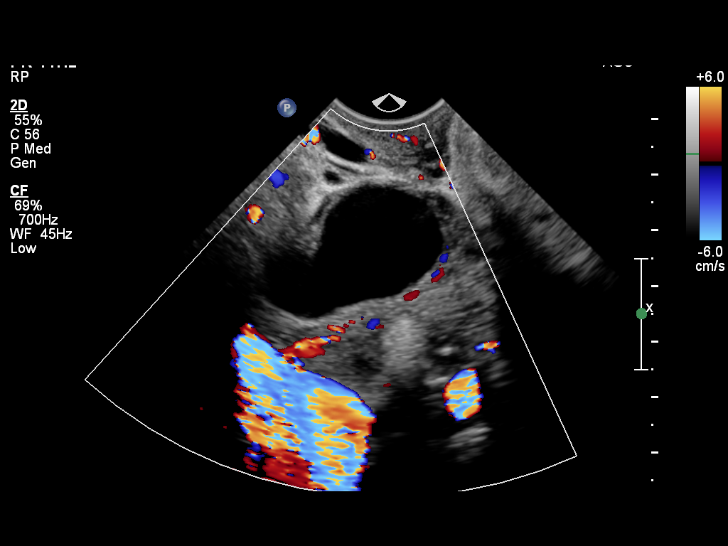
[im 45/54]
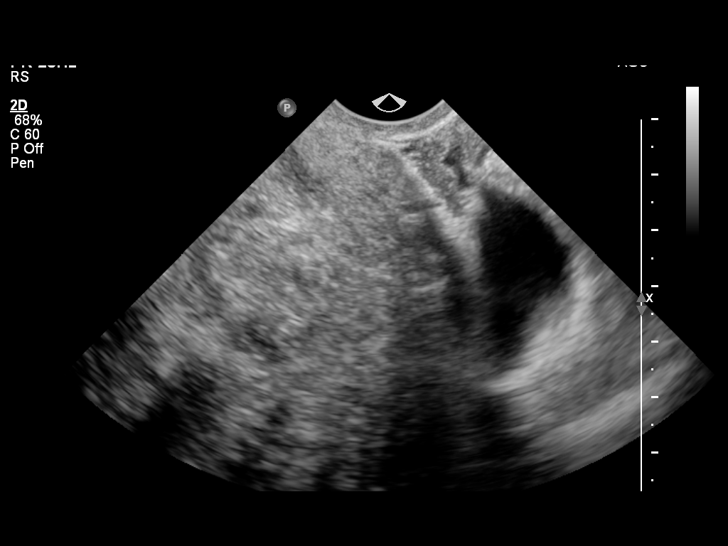
[im 49/54]
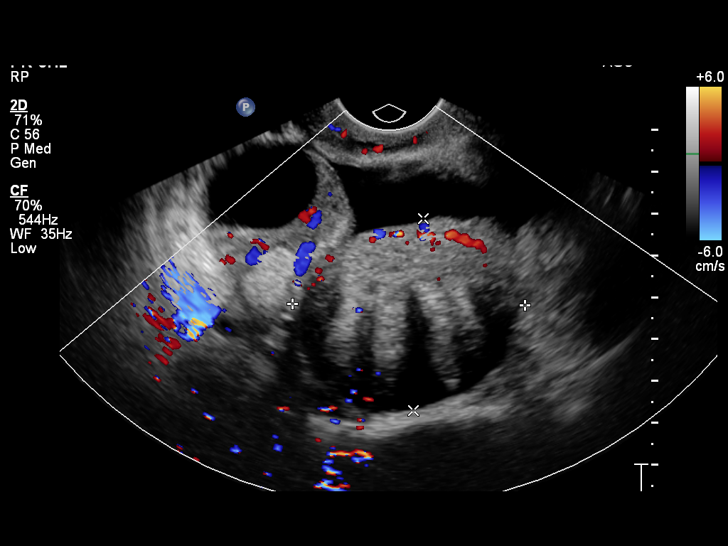
[im 54/54]
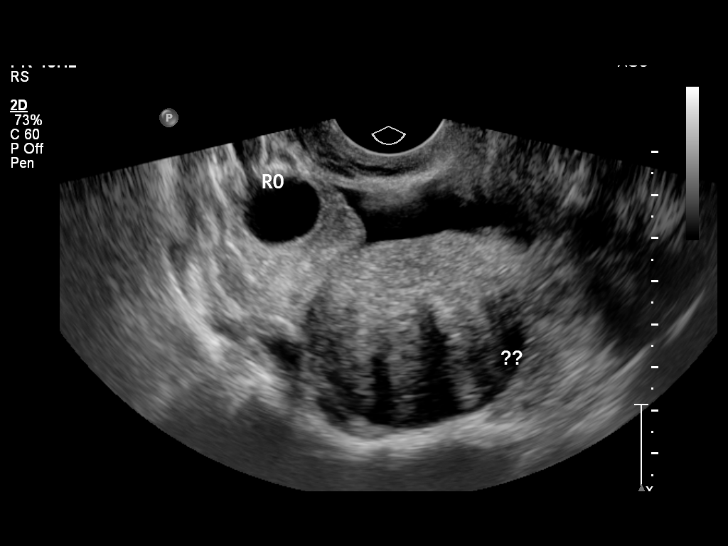

[13 of 25 positions shown; findings below may reference images not displayed]

FINDINGS: Uterus: 14.5 x 13.6 x 11.2 cm. Markedly heterogeneous uterine
echotexture. Well-defined lesion in the right side of the uterine
body measures 5.7 x 6.4 x 6.0 cm. A left-sided fundal lesion is
intramural versus subserosal and measures 6.7 x 4.1 x 4.6 cm.

Endometrium: Poorly evaluated secondary to the extent of presumed
fibroids. Measures maximally 12 mm.

Right Ovary: 4.6 x 2.9 x 3.0 cm. Dominant follicle within.
Hyperechoic deep right ovarian lesion is suspicious for a dermoid.
2.2 x 1.3 x 1.9 cm, including on image 41.

A heterogeneous "Mass" positioned medial and posterior to the right
ovary. Measure 6.4 x 5.6 x 4.6 cm. Example image 54.

Left Ovary:  Not visualized.

Other Findings: Small volume free pelvic fluid. There is right-sided
adnexal fluid. Localized adnexal fluid versus hydrosalpinx within
the left hemipelvis. Example image 46.
IMPRESSION: 1. Markedly heterogeneous uterine echotexture and enlargement.
Presumably related to uterine fibroids.
2. Hyperechoic right ovarian lesion, likely a dermoid.
3. "Mass" medial to the right ovary, which could represent an
exophytic right sided uterine fibroid. Suboptimally evaluated.
4. Localize left adnexal fluid versus hydrosalpinx. Pre and post
contrast pelvic MRI may be useful for further characterization of
the multiple above findings.

## 2016-02-03 ENCOUNTER — Other Ambulatory Visit: Payer: Self-pay | Admitting: Family Medicine

## 2016-02-03 DIAGNOSIS — Z1231 Encounter for screening mammogram for malignant neoplasm of breast: Secondary | ICD-10-CM

## 2016-02-10 ENCOUNTER — Ambulatory Visit: Payer: Self-pay

## 2016-02-12 ENCOUNTER — Ambulatory Visit
Admission: RE | Admit: 2016-02-12 | Discharge: 2016-02-12 | Disposition: A | Discharge status: Home / Self Care | Payer: BLUE CROSS/BLUE SHIELD | Source: Ambulatory Visit | Attending: Family Medicine | Admitting: Family Medicine

## 2016-02-12 DIAGNOSIS — Z1231 Encounter for screening mammogram for malignant neoplasm of breast: Secondary | ICD-10-CM

## 2016-08-07 ENCOUNTER — Ambulatory Visit: Payer: BLUE CROSS/BLUE SHIELD | Attending: Family Medicine | Admitting: Family Medicine

## 2016-08-07 ENCOUNTER — Encounter: Payer: Self-pay | Admitting: Family Medicine

## 2016-08-07 VITALS — BP 161/85 | HR 77 | Temp 98.3°F | Ht 65.25 in | Wt 197.4 lb

## 2016-08-07 DIAGNOSIS — D649 Anemia, unspecified: Secondary | ICD-10-CM

## 2016-08-07 DIAGNOSIS — E559 Vitamin D deficiency, unspecified: Secondary | ICD-10-CM

## 2016-08-07 DIAGNOSIS — Z23 Encounter for immunization: Secondary | ICD-10-CM

## 2016-08-07 DIAGNOSIS — Z124 Encounter for screening for malignant neoplasm of cervix: Secondary | ICD-10-CM

## 2016-08-07 DIAGNOSIS — J3089 Other allergic rhinitis: Secondary | ICD-10-CM | POA: Diagnosis not present

## 2016-08-07 DIAGNOSIS — Z881 Allergy status to other antibiotic agents status: Secondary | ICD-10-CM | POA: Insufficient documentation

## 2016-08-07 DIAGNOSIS — I10 Essential (primary) hypertension: Secondary | ICD-10-CM | POA: Diagnosis not present

## 2016-08-07 DIAGNOSIS — D259 Leiomyoma of uterus, unspecified: Secondary | ICD-10-CM | POA: Diagnosis not present

## 2016-08-07 DIAGNOSIS — Z01419 Encounter for gynecological examination (general) (routine) without abnormal findings: Secondary | ICD-10-CM | POA: Insufficient documentation

## 2016-08-07 DIAGNOSIS — Z0000 Encounter for general adult medical examination without abnormal findings: Secondary | ICD-10-CM

## 2016-08-07 LAB — COMPLETE METABOLIC PANEL WITH GFR
ALT: 16 U/L (ref 6–29)
AST: 28 U/L (ref 10–35)
Albumin: 3.7 g/dL (ref 3.6–5.1)
Alkaline Phosphatase: 53 U/L (ref 33–130)
BUN: 15 mg/dL (ref 7–25)
CHLORIDE: 107 mmol/L (ref 98–110)
CO2: 21 mmol/L (ref 20–31)
CREATININE: 0.92 mg/dL (ref 0.50–1.05)
Calcium: 8.5 mg/dL — ABNORMAL LOW (ref 8.6–10.4)
GFR, EST AFRICAN AMERICAN: 84 mL/min (ref >=60)
GFR, Est Non African American: 73 mL/min (ref >=60)
GLUCOSE: 90 mg/dL (ref 65–99)
Potassium: 4 mmol/L (ref 3.5–5.3)
SODIUM: 138 mmol/L (ref 135–146)
Total Bilirubin: 0.4 mg/dL (ref 0.2–1.2)
Total Protein: 6.9 g/dL (ref 6.1–8.1)

## 2016-08-07 LAB — CBC
HCT: 30.9 % — ABNORMAL LOW (ref 35.0–45.0)
HEMOGLOBIN: 10.1 g/dL — AB (ref 11.7–15.5)
MCH: 29.4 pg (ref 27.0–33.0)
MCHC: 32.7 g/dL (ref 32.0–36.0)
MCV: 89.8 fL (ref 80.0–100.0)
MPV: 11.1 fL (ref 7.5–12.5)
PLATELETS: 303 10*3/uL (ref 140–400)
RBC: 3.44 MIL/uL — ABNORMAL LOW (ref 3.80–5.10)
RDW: 16.1 % — ABNORMAL HIGH (ref 11.0–15.0)
WBC: 6.5 10*3/uL (ref 3.8–10.8)

## 2016-08-07 LAB — LIPID PANEL
Cholesterol: 195 mg/dL (ref <200)
HDL: 75 mg/dL (ref >50)
LDL CALC: 99 mg/dL (ref <100)
TRIGLYCERIDES: 103 mg/dL (ref <150)
Total CHOL/HDL Ratio: 2.6 Ratio (ref <5.0)
VLDL: 21 mg/dL (ref <30)

## 2016-08-07 MED ORDER — FLUTICASONE PROPIONATE 50 MCG/ACT NA SUSP: 2.0000 | Freq: Every day | NASAL | 6 refills | Status: DC

## 2016-08-07 MED ORDER — AMLODIPINE BESYLATE 5 MG PO TABS: 5.0000 mg | ORAL_TABLET | Freq: Every day | ORAL | 3 refills | Status: DC

## 2016-08-07 MED ORDER — KETOTIFEN FUMARATE 0.025 % OP SOLN: 1.0000 [drp] | Freq: Two times a day (BID) | OPHTHALMIC | 1 refills | Status: DC

## 2016-08-07 MED FILL — AMLODIPINE BESYLATE 5 MG TA: 5 | 30 days supply | Qty: 30 | Fill #0

## 2016-08-07 MED FILL — FLUTICASONE PROP 50 MCG SPR: 50 | 30 days supply | Qty: 16 | Fill #0

## 2016-08-07 NOTE — Assessment & Plan Note (Signed)
Flonase Patanol

## 2016-08-07 NOTE — Assessment & Plan Note (Signed)
A: Persistent HTN Med: compliant P: Norvasc 5 mg daily

## 2016-08-07 NOTE — Patient Instructions (Addendum)
Autumn Gomez was seen today for annual exam.  Diagnoses and all orders for this visit:  Essential hypertension -     amLODipine (NORVASC) 5 MG tablet; Take 1 tablet (5 mg total) by mouth daily. -     COMPLETE METABOLIC PANEL WITH GFR -     Lipid Panel  Acute allergic rhinitis due to other allergen, unspecified seasonality -     ketotifen (ZADITOR) 0.025 % ophthalmic solution; Place 1 drop into both eyes 2 (two) times daily. -     fluticasone (FLONASE) 50 MCG/ACT nasal spray; Place 2 sprays into both nostrils daily.  Pap smear for cervical cancer screening -     Cytology - PAP  Vitamin D deficiency -     Vitamin D, 25-hydroxy  Mild anemia -     CBC  Healthcare maintenance -     Ambulatory referral to Gastroenterology  Uterine leiomyoma, unspecified location  if you develop pelvic pain or heavy bleeding, please call and I will set up pelvic ultrasound and gyn referral for fibroids  F/u in 2 weeks for RN BP check since starting norvasc   F.u with me in 4  Weeks for HTN  Dr. Adrian Blackwater

## 2016-08-07 NOTE — Assessment & Plan Note (Signed)
Painless, normal flow  Having periods  Advised patient calls if she develop pain or abnormal bleeding with plan for f/u pelvic and TVUS and gyn referral

## 2016-08-07 NOTE — Progress Notes (Signed)
Pt is here today for employment physical and TB skin test. Pt is getting flu shot today.

## 2016-08-07 NOTE — Progress Notes (Signed)
SUBJECTIVE:  51 y.o. female for annual routine check up and pap smear. She also needs yearly TB skin test for her works as a Ship broker in the afternoons. She works for [employer] during the day.    Social History  Substance Use Topics  . Smoking status: Never Smoker  . Smokeless tobacco: Never Used  . Alcohol use Yes     Comment: rarely   Current Outpatient Prescriptions  Medication Sig Dispense Refill  . cetirizine (ZYRTEC) 10 MG tablet Take 1 tablet (10 mg total) by mouth daily. (Patient not taking: Reported on 08/07/2016) 30 tablet 11  . Cholecalciferol (VITAMIN D3) 2000 UNITS TABS Take 2,000 Units by mouth daily. (Patient not taking: Reported on 08/07/2016) 30 tablet 11  . ketoconazole (NIZORAL) 2 % cream Apply 1 application topically daily. Apply to R upper abdominal rash (Patient not taking: Reported on 08/07/2016) 60 g 2  . Vitamin D, Ergocalciferol, (DRISDOL) 50000 UNITS CAPS capsule Take 1 capsule (50,000 Units total) by mouth every 7 (seven) days. For 12 weeks (Patient not taking: Reported on 08/07/2016) 4 capsule 2   No current facility-administered medications for this visit.    Allergies: Keflex [cephalexin]  Patient's last menstrual period was 07/07/2016.  ROS:  Feeling well. No dyspnea or chest pain on exertion.  No abdominal pain, change in bowel habits, black or bloody stools.  No urinary tract symptoms. GYN ROS: normal menses, no abnormal bleeding, pelvic pain or discharge, no breast pain or new or enlarging lumps on self exam. No neurological complaints.  OBJECTIVE:  The patient appears well, alert, oriented x 3, in no distress. BP (!) 161/85 (BP Location: Left Arm, Patient Position: Sitting, Cuff Size: Small)   Pulse 77   Temp 98.3 F (36.8 C) (Oral)   Ht 5' 5.25" (1.657 m)   Wt 197 lb 6.4 oz (89.5 kg)   LMP 07/07/2016   SpO2 100%   BMI 32.60 kg/m   BP Readings from Last 3 Encounters:  08/07/16 (!) 161/85  12/12/14 (!) 159/97  11/29/14 (!) 164/100    Wt  Readings from Last 3 Encounters:  08/07/16 197 lb 6.4 oz (89.5 kg)  12/12/14 199 lb (90.3 kg)  11/29/14 198 lb (89.8 kg)   ENT normal.  Neck supple. No adenopathy or thyromegaly. PERLA. Lungs are clear, good air entry, no wheezes, rhonchi or rales. S1 and S2 normal, no murmurs, regular rate and rhythm. Abdomen soft without tenderness, guarding, mass or organomegaly. Extremities show no edema, normal peripheral pulses. Neurological is normal, no focal findings.  BREAST EXAM: breasts appear normal, no suspicious masses, no skin or nipple changes or axillary nodes  PELVIC EXAM: normal external genitalia. Firm golf-ball sized uterine mass in L lower uterine segment. R upper uterine mass also golf-ball sized. Scant white vaginal discharge. No tenderness on bimanual exam.   ASSESSMENT:  well woman Uterine fibroids-asymptotic HTN    PLAN:  Mammogram yearly due in 01/2016  pap smear  GI referral for screening colonoscopy

## 2016-08-08 LAB — VITAMIN D 25 HYDROXY (VIT D DEFICIENCY, FRACTURES): Vit D, 25-Hydroxy: 10 ng/mL — ABNORMAL LOW (ref 30–100)

## 2016-08-10 LAB — CERVICOVAGINAL ANCILLARY ONLY: Wet Prep (BD Affirm): POSITIVE — AB

## 2016-08-10 LAB — CYTOLOGY - PAP
CHLAMYDIA, DNA PROBE: NEGATIVE
Diagnosis: NEGATIVE
HPV (WINDOPATH): NOT DETECTED
NEISSERIA GONORRHEA: NEGATIVE

## 2016-08-11 MED ORDER — VITAMIN D (ERGOCALCIFEROL) 1.25 MG (50000 UNIT) PO CAPS: 50000.0000 [IU] | ORAL_CAPSULE | ORAL | 2 refills | Status: DC

## 2016-08-11 MED FILL — VIT D2 1.25 MG (50,000 UNIT: 1.25 MG | 28 days supply | Qty: 4 | Fill #0

## 2016-08-11 NOTE — Addendum Note (Signed)
Addended by: Boykin Nearing on: 08/11/2016 08:32 AM   Modules accepted: Orders

## 2016-08-12 ENCOUNTER — Telehealth: Payer: Self-pay

## 2016-08-12 NOTE — Telephone Encounter (Signed)
Pt was called and informed of lab results and medications being sent over to pharmacy.

## 2016-08-18 ENCOUNTER — Ambulatory Visit: Payer: BLUE CROSS/BLUE SHIELD | Attending: Family Medicine | Admitting: *Deleted

## 2016-08-18 DIAGNOSIS — Z111 Encounter for screening for respiratory tuberculosis: Secondary | ICD-10-CM

## 2016-08-18 NOTE — Progress Notes (Signed)
PPD Placement note Autumn Gomez, 51 y.o. 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 Yes Is patient taking any oral or IV steroid medication now or have they taken it in the last month? no Has the patient ever received the BCG vaccine?: no Has the patient been in recent contact with anyone known or suspected of having active TB disease?: no  PPD placed on 08/18/2016.  Patient advised to return for reading within 48-72 hours.

## 2016-08-24 ENCOUNTER — Encounter: Payer: Self-pay | Admitting: Family Medicine

## 2016-08-24 ENCOUNTER — Ambulatory Visit: Payer: BLUE CROSS/BLUE SHIELD | Attending: Family Medicine | Admitting: Family Medicine

## 2016-08-24 VITALS — BP 148/83 | HR 74 | Temp 98.2°F | Ht 65.25 in | Wt 196.4 lb

## 2016-08-24 DIAGNOSIS — Z79899 Other long term (current) drug therapy: Secondary | ICD-10-CM | POA: Diagnosis not present

## 2016-08-24 DIAGNOSIS — I10 Essential (primary) hypertension: Secondary | ICD-10-CM

## 2016-08-24 MED ORDER — AMLODIPINE BESYLATE 10 MG PO TABS: 10.0000 mg | ORAL_TABLET | Freq: Every day | ORAL | 3 refills | Status: DC

## 2016-08-24 MED FILL — AMLODIPINE BESYLATE 10 MG T: 10 | 30 days supply | Qty: 30 | Fill #0

## 2016-08-24 NOTE — Assessment & Plan Note (Signed)
HTN improved with norvasc 5 mg  Plan: Increase norvasc to 10 mg daily  F/u in 2 weeks for BP check Continue exercise, low salt diet

## 2016-08-24 NOTE — Progress Notes (Signed)
Pt is here today to follow up on HTN.

## 2016-08-24 NOTE — Patient Instructions (Addendum)
Autumn Gomez was seen today for hypertension.  Diagnoses and all orders for this visit:  Essential hypertension -     amLODipine (NORVASC) 10 MG tablet; Take 1 tablet (10 mg total) by mouth daily.    F/u in 2 weeks for HTN   Dr. Adrian Blackwater

## 2016-08-24 NOTE — Progress Notes (Signed)
   Subjective:  Patient ID: Autumn Gomez, female    DOB: 1966-01-04  Age: 51 y.o. MRN: [id number]  CC: Hypertension   HPI Lilika Trimnal presents for    1. CHRONIC HYPERTENSION  Disease Monitoring  Blood pressure range: not checking   Chest pain: no   Dyspnea: no   Claudication: no   Medication compliance: yes, norvasc 5 mg daily  Medication Side Effects  Lightheadedness: no   Urinary frequency: no   Edema: no   Impotence: no   Preventitive Healthcare:  Exercise: yes, walking outside of work     Social History  Substance Use Topics  . Smoking status: Never Smoker  . Smokeless tobacco: Never Used  . Alcohol use Yes     Comment: rarely    Outpatient Medications Prior to Visit  Medication Sig Dispense Refill  . amLODipine (NORVASC) 5 MG tablet Take 1 tablet (5 mg total) by mouth daily. 90 tablet 3  . fluticasone (FLONASE) 50 MCG/ACT nasal spray Place 2 sprays into both nostrils daily. 16 g 6  . ketotifen (ZADITOR) 0.025 % ophthalmic solution Place 1 drop into both eyes 2 (two) times daily. 10 mL 1  . Vitamin D, Ergocalciferol, (DRISDOL) 50000 units CAPS capsule Take 1 capsule (50,000 Units total) by mouth every 7 (seven) days. For 12 weeks 4 capsule 2   No facility-administered medications prior to visit.     ROS Review of Systems  Constitutional: Negative for chills and fever.  Eyes: Negative for visual disturbance.  Respiratory: Negative for shortness of breath.   Cardiovascular: Negative for chest pain.  Gastrointestinal: Negative for abdominal pain and blood in stool.  Musculoskeletal: Negative for arthralgias and back pain.  Skin: Negative for rash.  Allergic/Immunologic: Negative for immunocompromised state.  Hematological: Negative for adenopathy. Does not bruise/bleed easily.  Psychiatric/Behavioral: Negative for dysphoric mood and suicidal ideas.    Objective:  BP (!) 148/83 (BP Location: Left Arm, Patient Position: Sitting, Cuff Size: Small)    Pulse 74   Temp 98.2 F (36.8 C) (Oral)   Ht 5' 5.25" (1.657 m)   Wt 196 lb 6.4 oz (89.1 kg)   LMP 08/07/2016   SpO2 100%   BMI 32.43 kg/m   BP/Weight 08/24/2016 08/07/2016 [id number]  Systolic BP 123456 [id number] [id number]  Diastolic BP 83 85 97  Wt. (Lbs) 196.4 197.4 199  BMI 32.43 32.6 32.88   Physical Exam  Constitutional: She is oriented to person, place, and time. She appears well-developed and well-nourished. No distress.  HENT:  Head: Normocephalic and atraumatic.  Cardiovascular: Normal rate, regular rhythm, normal heart sounds and intact distal pulses.   Pulmonary/Chest: Effort normal and breath sounds normal.  Musculoskeletal: She exhibits no edema.  Neurological: She is alert and oriented to person, place, and time.  Skin: Skin is warm and dry. No rash noted.  Psychiatric: She has a normal mood and affect.     Assessment & Plan:  Janashia was seen today for hypertension.  Diagnoses and all orders for this visit:  Essential hypertension -     amLODipine (NORVASC) 10 MG tablet; Take 1 tablet (10 mg total) by mouth daily.   There are no diagnoses linked to this encounter.  No orders of the defined types were placed in this encounter.   Follow-up: Return in about 2 weeks (around 09/07/2016) for HTN .   Boykin Nearing MD

## 2016-09-04 ENCOUNTER — Encounter: Payer: Self-pay | Admitting: Family Medicine

## 2016-09-07 ENCOUNTER — Encounter: Payer: Self-pay | Admitting: Family Medicine

## 2016-09-07 ENCOUNTER — Ambulatory Visit: Payer: BLUE CROSS/BLUE SHIELD | Admitting: Licensed Clinical Social Worker

## 2016-09-07 ENCOUNTER — Ambulatory Visit: Payer: BLUE CROSS/BLUE SHIELD | Attending: Family Medicine | Admitting: Family Medicine

## 2016-09-07 VITALS — BP 129/79 | HR 83 | Temp 98.2°F | Resp 18 | Ht 65.5 in | Wt 191.6 lb

## 2016-09-07 DIAGNOSIS — S68119S Complete traumatic metacarpophalangeal amputation of unspecified finger, sequela: Secondary | ICD-10-CM

## 2016-09-07 DIAGNOSIS — Z89022 Acquired absence of left finger(s): Secondary | ICD-10-CM | POA: Insufficient documentation

## 2016-09-07 DIAGNOSIS — F4381 Prolonged grief disorder: Secondary | ICD-10-CM

## 2016-09-07 DIAGNOSIS — F4329 Adjustment disorder with other symptoms: Secondary | ICD-10-CM

## 2016-09-07 DIAGNOSIS — Z79899 Other long term (current) drug therapy: Secondary | ICD-10-CM | POA: Insufficient documentation

## 2016-09-07 DIAGNOSIS — F4321 Adjustment disorder with depressed mood: Secondary | ICD-10-CM | POA: Insufficient documentation

## 2016-09-07 DIAGNOSIS — S68119A Complete traumatic metacarpophalangeal amputation of unspecified finger, initial encounter: Secondary | ICD-10-CM | POA: Insufficient documentation

## 2016-09-07 DIAGNOSIS — Z7951 Long term (current) use of inhaled steroids: Secondary | ICD-10-CM | POA: Insufficient documentation

## 2016-09-07 DIAGNOSIS — I10 Essential (primary) hypertension: Secondary | ICD-10-CM

## 2016-09-07 NOTE — Assessment & Plan Note (Signed)
A: well controlled Med: compliant P: Continue norvasc 10 mg daily

## 2016-09-07 NOTE — Patient Instructions (Addendum)
Autumn Gomez was seen today for hypertension.  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  f/u in 3 months for HTN   Dr. Adrian Blackwater

## 2016-09-07 NOTE — Progress Notes (Signed)
Patient is here for HTN FU  Patient has taken medication today. Patient has eaten today.  Patient denies pain at this time.  Patient denies any suicidal ideations at this time.

## 2016-09-07 NOTE — Assessment & Plan Note (Signed)
Internal referral to clinical social worker

## 2016-09-07 NOTE — Progress Notes (Signed)
Subjective:  Patient ID: Autumn Gomez, female    DOB: 28-Oct-1965  Age: 51 y.o. MRN: [id number]  CC: Hypertension   HPI Autumn Gomez presents for    1. CHRONIC HYPERTENSION  Disease Monitoring  Blood pressure range: not checking   Chest pain: no   Dyspnea: no   Claudication: no   Medication compliance: yes, norvasc 10 mg daily  Medication Side Effects  Lightheadedness: no   Urinary frequency: no   Edema: no   Impotence: no   Preventitive Healthcare:  Exercise: yes, walking outside of work    2. Grief: reports sadness and fear. Symptoms started after her dad passed away from lung cancer 10 years ago. He was diagnosed and passed 4 months later. Her mother is 60 years old. She fears that her mother will pass away. The patient lives alone. She is single. No children.  3. 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 Painless. She is interested in cosmetic prosthesis. She request referral back to Hand Surgeon for PT.   Social History  Substance Use Topics  . Smoking status: Never Smoker  . Smokeless tobacco: Never Used  . Alcohol use Yes     Comment: rarely    Outpatient Medications Prior to Visit  Medication Sig Dispense Refill  . amLODipine (NORVASC) 10 MG tablet Take 1 tablet (10 mg total) by mouth daily. 90 tablet 3  . fluticasone (FLONASE) 50 MCG/ACT nasal spray Place 2 sprays into both nostrils daily. 16 g 6  . ketotifen (ZADITOR) 0.025 % ophthalmic solution Place 1 drop into both eyes 2 (two) times daily. 10 mL 1  . Vitamin D, Ergocalciferol, (DRISDOL) 50000 units CAPS capsule Take 1 capsule (50,000 Units total) by mouth every 7 (seven) days. For 12 weeks 4 capsule 2   No facility-administered medications prior to visit.     ROS Review of Systems  Constitutional: Negative for chills and fever.  Eyes: Negative for visual disturbance.  Respiratory: Negative for shortness of breath.   Cardiovascular: Negative for chest pain.  Gastrointestinal:  Negative for abdominal pain and blood in stool.  Musculoskeletal: Negative for arthralgias and back pain.  Skin: Negative for rash.  Allergic/Immunologic: Negative for immunocompromised state.  Hematological: Negative for adenopathy. Does not bruise/bleed easily.  Psychiatric/Behavioral: Negative for dysphoric mood and suicidal ideas.    Objective:  BP 129/79 (BP Location: Right Arm, Patient Position: Sitting, Cuff Size: Normal)   Pulse 83   Temp 98.2 F (36.8 C) (Oral)   Resp 18   Ht 5' 5.5" (1.664 m)   Wt 191 lb 9.6 oz (86.9 kg)   LMP 09/07/2016   SpO2 98%   BMI 31.40 kg/m   BP/Weight 09/07/2016 08/24/2016 2/44/0102  Systolic BP 725 366 440  Diastolic BP 79 83 85  Wt. (Lbs) 191.6 196.4 197.4  BMI 31.4 32.43 32.6   Physical Exam  Constitutional: She is oriented to person, place, and time. She appears well-developed and well-nourished. No distress.  HENT:  Head: Normocephalic and atraumatic.  Cardiovascular: Normal rate, regular rhythm, normal heart sounds and intact distal pulses.   Pulmonary/Chest: Effort normal and breath sounds normal.  Musculoskeletal: She exhibits no edema.  Neurological: She is alert and oriented to person, place, and time.  Skin: Skin is warm and dry. No rash noted.  Psychiatric: She has a normal mood and affect.  Tearful during exam    Depression screen [facility] 2/9 09/07/2016 08/24/2016 08/07/2016  Decreased Interest 1 0 0  Down, Depressed,  Hopeless 0 1 1  PHQ - 2 Score 1 1 1   Altered sleeping 1 1 0  Tired, decreased energy 0 0 0  Change in appetite 0 0 0  Feeling bad or failure about yourself  1 1 1   Trouble concentrating 0 0 0  Moving slowly or fidgety/restless 0 0 0  Suicidal thoughts 0 0 0  PHQ-9 Score 3 3 2    GAD 7 : Generalized Anxiety Score 09/07/2016 08/24/2016 08/07/2016  Nervous, Anxious, on Edge 1 1 1   Control/stop worrying 0 0 0  Worry too much - different things 1 1 1   Trouble relaxing 0 0 0  Restless 0 0 0  Easily annoyed or irritable  0 0 0  Afraid - awful might happen 1 1 1   Total GAD 7 Score 3 3 3       Assessment & Plan:  Andilynn was seen today for hypertension.  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  No orders of the defined types were placed in this encounter.   Follow-up: No Follow-up on file.   Autumn Nearing MD

## 2016-09-07 NOTE — Assessment & Plan Note (Signed)
Referral back to hand surgery

## 2016-09-09 NOTE — BH Specialist Note (Signed)
Integrated Behavioral Health Initial Visit  MRN: [id number] Name: Autumn Gomez   Session Start time: 10:55 AM Session End time: 11:30 AM Total time: 35 minutes  Type of Service: [place] Interpretor:No. Interpretor Name and Language: N/A   Warm Hand Off Completed.       SUBJECTIVE: Autumn Gomez is a 51 y.o. female accompanied by patient. Patient was referred by Dr. Adrian Blackwater for grief support. Patient reports the following symptoms/concerns: overwhelming feelings of sadness and worry that something bad may happen to loved ones Duration of problem: Ongoing; Severity of problem: mild  OBJECTIVE: Mood: Anxious and Affect: Tearful Risk of harm to self or others: No plan to harm self or others   LIFE CONTEXT: Family and Social: Pt has stable housing and resides independently. 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  Life Changes: Pt is grieving the loss of her father and brother in law. She is fearful that mother will pass away; however, reports mother is healthy and active in her community  GOALS ADDRESSED: Patient will reduce symptoms of: anxiety and increase knowledge and/or ability of: coping skills and also: Increase adequate support systems for patient/family and Begin healthy grieving over loss   INTERVENTIONS: Solution-Focused Strategies, Supportive Counseling and Psychoeducation and/or Health Education  Standardized Assessments completed: GAD-7, PHQ 2 and PHQ 9  ASSESSMENT: Patient currently experiencing overwhelming feelings of sadness and worry that something bad may happen to loved ones. Pt is grieving the loss of her father and brother in law. She is fearful that mother will pass away; however, reports mother is  healthy and active in her community. Patient may benefit from psychotherapy. LCSWA educated pt on the stages of grief and encouraged her to participate in support groups and grief counseling. LCSWA engaged pt on healthy coping skills. Pt identified a healthy coping skill to utilize on a weekly basis to decrease symptoms of depression and anxiety. Pt was provided community resources for crisis intervention, psychotherapy, and medication management.    PLAN: 1. Follow up with behavioral health clinician on : Pt was encouraged to contact LCSWA if symptoms worsen or fail to improve to schedule behavioral appointments at [facility]. 2. Behavioral recommendations: LCSWA recommends that pt apply healthy coping skills discussed, initiate grief counseling, and utilize resources provided. Pt is encouraged to schedule follow up appointment with LCSWA 3. Referral(s): [place] (LME/Outside Clinic) 4. "From scale of 1-10, how likely are you to follow plan?": 8/10  Rebekah Chesterfield, LCSW 09/09/16 10:47 AM

## 2016-10-05 MED FILL — AMLODIPINE BESYLATE 10 MG T: 10 | 30 days supply | Qty: 30 | Fill #1

## 2016-10-05 MED FILL — FLUTICASONE PROP 50 MCG SPR: 50 | 30 days supply | Qty: 16 | Fill #1

## 2016-11-04 ENCOUNTER — Encounter: Payer: Self-pay | Admitting: Family Medicine

## 2016-11-04 MED FILL — AMLODIPINE BESYLATE 10 MG T: 10 | 30 days supply | Qty: 30 | Fill #2

## 2016-12-02 MED FILL — AMLODIPINE BESYLATE 10 MG T: 10 | 30 days supply | Qty: 30 | Fill #3

## 2016-12-08 ENCOUNTER — Encounter: Payer: Self-pay | Admitting: Family Medicine

## 2016-12-08 ENCOUNTER — Ambulatory Visit: Payer: BLUE CROSS/BLUE SHIELD | Attending: Family Medicine | Admitting: Family Medicine

## 2016-12-08 VITALS — BP 129/77 | HR 72 | Temp 97.9°F | Wt 193.8 lb

## 2016-12-08 DIAGNOSIS — Z79899 Other long term (current) drug therapy: Secondary | ICD-10-CM | POA: Diagnosis not present

## 2016-12-08 DIAGNOSIS — E559 Vitamin D deficiency, unspecified: Secondary | ICD-10-CM | POA: Insufficient documentation

## 2016-12-08 DIAGNOSIS — Z801 Family history of malignant neoplasm of trachea, bronchus and lung: Secondary | ICD-10-CM | POA: Insufficient documentation

## 2016-12-08 DIAGNOSIS — I10 Essential (primary) hypertension: Secondary | ICD-10-CM | POA: Diagnosis not present

## 2016-12-08 DIAGNOSIS — Z89022 Acquired absence of left finger(s): Secondary | ICD-10-CM | POA: Insufficient documentation

## 2016-12-08 MED ORDER — AMLODIPINE BESYLATE 10 MG PO TABS: 10.0000 mg | ORAL_TABLET | Freq: Every day | ORAL | 3 refills | Status: DC

## 2016-12-08 NOTE — Progress Notes (Addendum)
Subjective:  Patient ID: Autumn Gomez, female    DOB: Nov 25, 1965  Age: 51 y.o. MRN: [id number]  CC: Hypertension   HPI Charvi Gammage presents for    1. CHRONIC HYPERTENSION  Disease Monitoring  Blood pressure range: not checking   Chest pain: no   Dyspnea: no   Claudication: no   Medication compliance: yes, norvasc 10 mg daily  Medication Side Effects  Lightheadedness: no   Urinary frequency: no   Edema: no   Impotence: no   Preventitive Healthcare:  Exercise: yes, walking outside of work    2. Grief: improved. Symptoms started after her dad passed away from lung cancer 10 years ago. He was diagnosed and passed 4 months later. She spoke with her mother who is 100 yo. Working still and doing well.  reports sadness and fear. The patient lives alone. She is single. No children.  3. 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 Painless. She is interested in cosmetic prosthesis. Referral was placed back to hand surgery.   Social History  Substance Use Topics  . Smoking status: Never Smoker  . Smokeless tobacco: Never Used  . Alcohol use Yes     Comment: rarely    Outpatient Medications Prior to Visit  Medication Sig Dispense Refill  . amLODipine (NORVASC) 10 MG tablet Take 1 tablet (10 mg total) by mouth daily. 90 tablet 3  . fluticasone (FLONASE) 50 MCG/ACT nasal spray Place 2 sprays into both nostrils daily. 16 g 6  . ketotifen (ZADITOR) 0.025 % ophthalmic solution Place 1 drop into both eyes 2 (two) times daily. 10 mL 1  . Vitamin D, Ergocalciferol, (DRISDOL) 50000 units CAPS capsule Take 1 capsule (50,000 Units total) by mouth every 7 (seven) days. For 12 weeks 4 capsule 2   No facility-administered medications prior to visit.     ROS Review of Systems  Constitutional: Negative for chills and fever.  Eyes: Negative for visual disturbance.  Respiratory: Negative for shortness of breath.   Cardiovascular: Negative for chest pain.    Gastrointestinal: Negative for abdominal pain and blood in stool.  Musculoskeletal: Negative for arthralgias and back pain.  Skin: Negative for rash.  Allergic/Immunologic: Negative for immunocompromised state.  Hematological: Negative for adenopathy. Does not bruise/bleed easily.  Psychiatric/Behavioral: Negative for dysphoric mood and suicidal ideas.    Objective:  BP 129/77   Pulse 72   Temp 97.9 F (36.6 C) (Oral)   Wt 193 lb 12.8 oz (87.9 kg)   SpO2 100%   BMI 31.76 kg/m   BP/Weight 12/08/2016 8/33/8250 10/22/9765  Systolic BP 341 937 902  Diastolic BP 77 79 83  Wt. (Lbs) 193.8 191.6 196.4  BMI 31.76 31.4 32.43   Physical Exam  Constitutional: She is oriented to person, place, and time. She appears well-developed and well-nourished. No distress.  HENT:  Head: Normocephalic and atraumatic.  Cardiovascular: Normal rate, regular rhythm, normal heart sounds and intact distal pulses.   Pulmonary/Chest: Effort normal and breath sounds normal.  Musculoskeletal: She exhibits no edema.  Neurological: She is alert and oriented to person, place, and time.  Skin: Skin is warm and dry. No rash noted.  Psychiatric: She has a normal mood and affect.  Tearful during exam    Depression screen [facility] 2/9 09/07/2016 08/24/2016 08/07/2016  Decreased Interest 1 0 0  Down, Depressed, Hopeless 0 1 1  PHQ - 2 Score 1 1 1   Altered sleeping 1 1 0  Tired, decreased energy 0 0  0  Change in appetite 0 0 0  Feeling bad or failure about yourself  1 1 1   Trouble concentrating 0 0 0  Moving slowly or fidgety/restless 0 0 0  Suicidal thoughts 0 0 0  PHQ-9 Score 3 3 2    GAD 7 : Generalized Anxiety Score 09/07/2016 08/24/2016 08/07/2016  Nervous, Anxious, on Edge 1 1 1   Control/stop worrying 0 0 0  Worry too much - different things 1 1 1   Trouble relaxing 0 0 0  Restless 0 0 0  Easily annoyed or irritable 0 0 0  Afraid - awful might happen 1 1 1   Total GAD 7 Score 3 3 3       Assessment & Plan:   Alira was seen today for hypertension.  Diagnoses and all orders for this visit:  Vitamin D deficiency -     Vitamin D, 25-hydroxy -     Vitamin D, Ergocalciferol, (DRISDOL) 50000 units CAPS capsule; Take 1 capsule (50,000 Units total) by mouth every 7 (seven) days. For 12 weeks  Essential hypertension -     amLODipine (NORVASC) 10 MG tablet; Take 1 tablet (10 mg total) by mouth daily.   There are no diagnoses linked to this encounter.  No orders of the defined types were placed in this encounter.   Follow-up: Return in about 3 months (around 03/10/2017) for HTN.   Boykin Nearing MD

## 2016-12-08 NOTE — Patient Instructions (Addendum)
Autumn Gomez was seen today for hypertension.  Diagnoses and all orders for this visit:  Vitamin D deficiency -     Vitamin D, 25-hydroxy  Essential hypertension -     amLODipine (NORVASC) 10 MG tablet; Take 1 tablet (10 mg total) by mouth daily.   [facility]  Address: [address], [place], [place] [address]  Phone: [phone]  [address], [place], [place] [address]  Phone: [phone]  F/u in 3 months for HTN  Dr. Adrian Blackwater

## 2016-12-08 NOTE — Assessment & Plan Note (Addendum)
A: vit D deficiency. Patient has completed supplement P: Recheck vit D level  Vit D improved but still low, retake vit D 50 K U weekly for 12 weeks

## 2016-12-09 LAB — VITAMIN D 25 HYDROXY (VIT D DEFICIENCY, FRACTURES): VIT D 25 HYDROXY: 12.5 ng/mL — AB (ref 30.0–100.0)

## 2016-12-09 MED ORDER — VITAMIN D (ERGOCALCIFEROL) 1.25 MG (50000 UNIT) PO CAPS: 50000.0000 [IU] | ORAL_CAPSULE | ORAL | 2 refills | Status: DC

## 2016-12-09 NOTE — Assessment & Plan Note (Signed)
A: well controlled Med: compliant P: Continue norvasc 10 mg daily

## 2016-12-09 NOTE — Addendum Note (Signed)
Addended by: Boykin Nearing on: 12/09/2016 07:08 PM   Modules accepted: Orders

## 2016-12-10 ENCOUNTER — Telehealth: Payer: Self-pay

## 2016-12-10 MED FILL — VIT D2 1.25 MG (50,000 UNIT: 1.25 MG | 4 days supply | Qty: 4 | Fill #0

## 2016-12-10 NOTE — Telephone Encounter (Signed)
Pt was called and informed of lab results and medication being sent to pharmacy.

## 2017-01-05 MED FILL — AMLODIPINE BESYLATE 10 MG T: 10 | 30 days supply | Qty: 30 | Fill #4

## 2017-01-07 MED FILL — FLUTICASONE PROP 50 MCG SPR: 50 | 30 days supply | Qty: 16 | Fill #2

## 2017-02-04 MED FILL — AMLODIPINE BESYLATE 10 MG T: 10 | 30 days supply | Qty: 30 | Fill #5

## 2017-03-08 MED FILL — AMLODIPINE BESYLATE 10 MG T: 10 | 30 days supply | Qty: 30 | Fill #6

## 2017-03-19 ENCOUNTER — Other Ambulatory Visit: Payer: Self-pay

## 2017-03-19 ENCOUNTER — Other Ambulatory Visit: Payer: Self-pay | Admitting: Family Medicine

## 2017-03-19 DIAGNOSIS — Z1231 Encounter for screening mammogram for malignant neoplasm of breast: Secondary | ICD-10-CM

## 2017-03-25 MED FILL — FLUTICASONE PROP 50 MCG SPR: 50 | 30 days supply | Qty: 16 | Fill #3

## 2017-04-06 ENCOUNTER — Ambulatory Visit
Admission: RE | Admit: 2017-04-06 | Discharge: 2017-04-06 | Disposition: A | Discharge status: Home / Self Care | Payer: BLUE CROSS/BLUE SHIELD | Source: Ambulatory Visit

## 2017-04-06 DIAGNOSIS — Z1231 Encounter for screening mammogram for malignant neoplasm of breast: Secondary | ICD-10-CM

## 2017-04-07 MED FILL — AMLODIPINE BESYLATE 10 MG T: 10 | 30 days supply | Qty: 30 | Fill #7

## 2017-04-09 ENCOUNTER — Telehealth: Payer: Self-pay

## 2017-04-09 NOTE — Telephone Encounter (Signed)
Pt was called and informed of mammogram results.

## 2017-05-12 ENCOUNTER — Encounter (HOSPITAL_COMMUNITY): Payer: Self-pay

## 2017-05-12 ENCOUNTER — Ambulatory Visit: Payer: BLUE CROSS/BLUE SHIELD | Attending: Family Medicine | Admitting: Family Medicine

## 2017-05-12 ENCOUNTER — Encounter: Payer: Self-pay | Admitting: Family Medicine

## 2017-05-12 VITALS — BP 120/73 | HR 73 | Temp 98.5°F | Resp 18 | Ht 65.0 in | Wt 193.6 lb

## 2017-05-12 DIAGNOSIS — Z79899 Other long term (current) drug therapy: Secondary | ICD-10-CM | POA: Diagnosis not present

## 2017-05-12 DIAGNOSIS — Z23 Encounter for immunization: Secondary | ICD-10-CM | POA: Insufficient documentation

## 2017-05-12 DIAGNOSIS — M1990 Unspecified osteoarthritis, unspecified site: Secondary | ICD-10-CM

## 2017-05-12 DIAGNOSIS — I8391 Asymptomatic varicose veins of right lower extremity: Secondary | ICD-10-CM | POA: Diagnosis not present

## 2017-05-12 DIAGNOSIS — I10 Essential (primary) hypertension: Secondary | ICD-10-CM | POA: Diagnosis present

## 2017-05-12 DIAGNOSIS — Z1211 Encounter for screening for malignant neoplasm of colon: Secondary | ICD-10-CM

## 2017-05-12 MED ORDER — MEDICAL COMPRESSION STOCKINGS MISC: 0 refills | Status: DC

## 2017-05-12 MED ORDER — NAPROXEN 500 MG PO TABS: ORAL_TABLET | ORAL | 0 refills | Status: DC

## 2017-05-12 MED ORDER — AMLODIPINE BESYLATE 10 MG PO TABS
10.0000 mg | ORAL_TABLET | Freq: Every day | ORAL | 1 refills | Status: DC
Start: 2017-05-12 — End: 2017-09-14

## 2017-05-12 MED FILL — NAPROXEN 500 MG TABLET: 500 | 20 days supply | Qty: 40 | Fill #0

## 2017-05-12 MED FILL — AMLODIPINE BESYLATE 10 MG T: 10 | 30 days supply | Qty: 30 | Fill #8

## 2017-05-12 NOTE — Progress Notes (Signed)
Subjective:  Patient ID: Autumn Gomez, female    DOB: 03-30-1966  Age: 51 y.o. MRN: [id number]  CC: Hypertension   HPI Autumn Gomez presents to re-establish care. PHM of hypertension. Hypertension: She is not exercising and is not adherent to low salt diet.  She does not check BP at home. Cardiac symptoms none. Patient denies chest pain, chest pressure/discomfort, claudication, dyspnea, near-syncope, palpitations and syncope.  Cardiovascular risk factors: hypertension, obesity (BMI >= 30 kg/m2) and sedentary lifestyle. Use of agents associated with hypertension: none. History of target organ damage: none. Patient complains of arthralgias for which has been present for several months. Pain is located in the right shoulder(s) and right ankle, is described as aching, and is intermittent .  Associated symptoms include: none.  The patient has OTC analgesics.  Related to injury: no. 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     Outpatient Medications Prior to Visit  Medication Sig Dispense Refill  . amLODipine (NORVASC) 10 MG tablet Take 1 tablet (10 mg total) by mouth daily. 90 tablet 3  . fluticasone (FLONASE) 50 MCG/ACT nasal spray Place 2 sprays into both nostrils daily. 16 g 6  . ketotifen (ZADITOR) 0.025 % ophthalmic solution Place 1 drop into both eyes 2 (two) times daily. 10 mL 1  . Vitamin D, Ergocalciferol, (DRISDOL) 50000 units CAPS capsule Take 1 capsule (50,000 Units total) by mouth every 7 (seven) days. For 12 weeks 4 capsule 2   No facility-administered medications prior to visit.     ROS Review of Systems  Constitutional: Negative.   Respiratory: Negative.   Cardiovascular: Negative.   Gastrointestinal: Negative.   Musculoskeletal: Positive for arthralgias.  Skin: Negative.     Objective:  BP 120/73   Pulse 73   Temp 98.5 F (36.9 C) (Oral)   Resp 18   Ht 5\' 5"  (1.651 m)   Wt 193 lb 9.6 oz (87.8 kg)   SpO2 100%   BMI 32.22 kg/m    BP/Weight 05/12/2017 12/08/2016 3/82/5053  Systolic BP 976 734 193  Diastolic BP 73 77 79  Wt. (Lbs) 193.6 193.8 191.6  BMI 32.22 31.76 31.4     Physical Exam  Constitutional: She appears well-developed and well-nourished.  Eyes: Conjunctivae are normal. Pupils are equal, round, and reactive to light.  Neck: No JVD present.  Cardiovascular: Normal rate, regular rhythm, normal heart sounds and intact distal pulses.  Pulmonary/Chest: Effort normal and breath sounds normal.  Abdominal: Soft. Bowel sounds are normal.  Musculoskeletal:       Right shoulder: She exhibits pain (with hyperextension). She exhibits normal pulse.       Right ankle: Tenderness. Lateral malleolus (with inversion) tenderness found.       Cervical back: She exhibits normal range of motion and no pain.  Skin: Skin is warm and dry.  Small varicose veins to posterior and lateral right lower extremity.  Nursing note and vitals reviewed.    Assessment & Plan:   1. Arthritis  - naproxen (NAPROSYN) 500 MG tablet; TAKE ONE TABLET BY MOUTH WITH MEALS TWICE A DAY AS NEEDED FOR PAIN.  Dispense: 40 tablet; Refill: 0  2. Essential hypertension  - amLODipine (NORVASC) 10 MG tablet; Take 1 tablet (10 mg total) by mouth daily.  Dispense: 90 tablet; Refill: 1  3. Screen for colon cancer  - Fecal occult blood, imunochemical  4. Needs flu shot  - Flu Vaccine QUAD 6+ mos PF IM (Fluarix Quad PF)  5. Asymptomatic  varicose veins of right lower extremity  - Elastic Bandages & Supports (MEDICAL COMPRESSION STOCKINGS) MISC; APPLY TO BILATERAL LOWER LEGS FOR SUPPORT. TO BE FITTED BY MEDICAL SUPPLY.  Dispense: 2 each; Refill: 0     Follow-up: Return in about 3 months (around 08/12/2017) for HTN.   Alfonse Spruce FNP

## 2017-05-12 NOTE — Progress Notes (Signed)
Patient is here for f/up   Patient complains right shoulder & knee pain

## 2017-05-12 NOTE — Patient Instructions (Addendum)
Osteoarthritis Osteoarthritis is a type of arthritis that affects tissue that covers the ends of bones in joints (cartilage). Cartilage acts as a cushion between the bones and helps them move smoothly. Osteoarthritis results when cartilage in the joints gets worn down. Osteoarthritis is sometimes called "wear and tear" arthritis. Osteoarthritis is the most common form of arthritis. It often occurs in older people. It is a condition that gets worse over time (a progressive condition). Joints that are most often affected by this condition are in:  Fingers.  Toes.  Hips.  Knees.  Spine, including neck and lower back.  What are the causes? This condition is caused by age-related wearing down of cartilage that covers the ends of bones. What increases the risk? The following factors may make you more likely to develop this condition:  Older age.  Being overweight or obese.  Overuse of joints, such as in athletes.  Past injury of a joint.  Past surgery on a joint.  Family history of osteoarthritis.  What are the signs or symptoms? The main symptoms of this condition are pain, swelling, and stiffness in the joint. The joint may lose its shape over time. Small pieces of bone or cartilage may break off and float inside of the joint, which may cause more pain and damage to the joint. Small deposits of bone (osteophytes) may grow on the edges of the joint. Other symptoms may include:  A grating or scraping feeling inside the joint when you move it.  Popping or creaking sounds when you move.  Symptoms may affect one or more joints. Osteoarthritis in a major joint, such as your knee or hip, can make it painful to walk or exercise. If you have osteoarthritis in your hands, you might not be able to grip items, twist your hand, or control small movements of your hands and fingers (fine motor skills). How is this diagnosed? This condition may be diagnosed based on:  Your medical history.  A  physical exam.  Your symptoms.  X-rays of the affected joint(s).  Blood tests to rule out other types of arthritis.  How is this treated? There is no cure for this condition, but treatment can help to control pain and improve joint function. Treatment plans may include:  A prescribed exercise program that allows for rest and joint relief. You may work with a physical therapist.  A weight control plan.  Pain relief techniques, such as: ? Applying heat and cold to the joint. ? Electric pulses delivered to nerve endings under the skin (transcutaneous electrical nerve stimulation, or TENS). ? Massage. ? Certain nutritional supplements.  NSAIDs or prescription medicines to help relieve pain.  Medicine to help relieve pain and inflammation (corticosteroids). This can be given by mouth (orally) or as an injection.  Assistive devices, such as a brace, wrap, splint, specialized glove, or cane.  Surgery, such as: ? An osteotomy. This is done to reposition the bones and relieve pain or to remove loose pieces of bone and cartilage. ? Joint replacement surgery. You may need this surgery if you have very bad (advanced) osteoarthritis.  Follow these instructions at home: Activity  Rest your affected joints as directed by your health care provider.  Do not drive or use heavy machinery while taking prescription pain medicine.  Exercise as directed. Your health care provider or physical therapist may recommend specific types of exercise, such as: ? Strengthening exercises. These are done to strengthen the muscles that support joints that are affected by arthritis.   They can be performed with weights or with exercise bands to add resistance. ? Aerobic activities. These are exercises, such as brisk walking or water aerobics, that get your heart pumping. ? Range-of-motion activities. These keep your joints easy to move. ? Balance and agility exercises. Managing pain, stiffness, and  swelling  If directed, apply heat to the affected area as often as told by your health care provider. Use the heat source that your health care provider recommends, such as a moist heat pack or a heating pad. ? If you have a removable assistive device, remove it as told by your health care provider. ? Place a towel between your skin and the heat source. If your health care provider tells you to keep the assistive device on while you apply heat, place a towel between the assistive device and the heat source. ? Leave the heat on for 20-30 minutes. ? Remove the heat if your skin turns bright red. This is especially important if you are unable to feel pain, heat, or cold. You may have a greater risk of getting burned.  If directed, put ice on the affected joint: ? If you have a removable assistive device, remove it as told by your health care provider. ? Put ice in a plastic bag. ? Place a towel between your skin and the bag. If your health care provider tells you to keep the assistive device on during icing, place a towel between the assistive device and the bag. ? Leave the ice on for 20 minutes, 2-3 times a day. General instructions  Take over-the-counter and prescription medicines only as told by your health care provider.  Maintain a healthy weight. Follow instructions from your health care provider for weight control. These may include dietary restrictions.  Do not use any products that contain nicotine or tobacco, such as cigarettes and e-cigarettes. These can delay bone healing. If you need help quitting, ask your health care provider.  Use assistive devices as directed by your health care provider.  Keep all follow-up visits as told by your health care provider. This is important. Where to find more information:  [employer] of Arthritis and Musculoskeletal and Skin Diseases: www.niams.SouthExposed.es  [employer] on Aging: http://kim-miller.com/  American College of Rheumatology:  www.rheumatology.org Contact a health care provider if:  Your skin turns red.  You develop a rash.  You have pain that gets worse.  You have a fever along with joint or muscle aches. Get help right away if:  You lose a lot of weight.  You suddenly lose your appetite.  You have night sweats. Summary  Osteoarthritis is a type of arthritis that affects tissue covering the ends of bones in joints (cartilage).  This condition is caused by age-related wearing down of cartilage that covers the ends of bones.  The main symptom of this condition is pain, swelling, and stiffness in the joint.  There is no cure for this condition, but treatment can help to control pain and improve joint function. This information is not intended to replace advice given to you by your health care provider. Make sure you discuss any questions you have with your health care provider. Document Released: 06/08/2005 Document Revised: 02/10/2016 Document Reviewed: 02/10/2016 Elsevier Interactive Patient Education  2018 [employer].   Varicose Veins Varicose veins are veins that have become enlarged and twisted. They are usually seen in the legs but can occur in other parts of the body as well. What are the causes? This condition  is the result of valves in the veins not working properly. Valves in the veins help to return blood from the leg to the heart. If these valves are damaged, blood flows backward and backs up into the veins in the leg near the skin. This causes the veins to become larger. What increases the risk? People who are on their feet a lot, who are pregnant, or who are overweight are more likely to develop varicose veins. What are the signs or symptoms?  Bulging, twisted-appearing, bluish veins, most commonly found on the legs.  Leg pain or a feeling of heaviness. These symptoms may be worse at the end of the day.  Leg swelling.  Changes in skin color. How is this diagnosed? A health care  provider can usually diagnose varicose veins by examining your legs. Your health care provider may also recommend an ultrasound of your leg veins. How is this treated? Most varicose veins can be treated at home.However, other treatments are available for people who have persistent symptoms or want to improve the cosmetic appearance of the varicose veins. These treatment options include:  Sclerotherapy. A solution is injected into the vein to close it off.  Laser treatment. A laser is used to heat the vein to close it off.  Radiofrequency vein ablation. An electrical current produced by radio waves is used to close off the vein.  Phlebectomy. The vein is surgically removed through small incisions made over the varicose vein.  Vein ligation and stripping. The vein is surgically removed through incisions made over the varicose vein after the vein has been tied (ligated).  Follow these instructions at home:  Do not stand or sit in one position for long periods of time. Do not sit with your legs crossed. Rest with your legs raised during the day.  Wear compression stockings as directed by your health care provider. These stockings help to prevent blood clots and reduce swelling in your legs.  Do not wear other tight, encircling garments around your legs, pelvis, or waist.  Walk as much as possible to increase blood flow.  Raise the foot of your bed at night with 2-inch blocks.  If you get a cut in the skin over the vein and the vein bleeds, lie down with your leg raised and press on it with a clean cloth until the bleeding stops. Then place a bandage (dressing) on the cut. See your health care provider if it continues to bleed. Contact a health care provider if:  The skin around your ankle starts to break down.  You have pain, redness, tenderness, or hard swelling in your leg over a vein.  You are uncomfortable because of leg pain. This information is not intended to replace advice  given to you by your health care provider. Make sure you discuss any questions you have with your health care provider. Document Released: 03/18/2005 Document Revised: 11/14/2015 Document Reviewed: 12/10/2015 Elsevier Interactive Patient Education  2017 [employer].

## 2017-06-07 MED FILL — AMLODIPINE BESYLATE 10 MG T: 10 | 30 days supply | Qty: 30 | Fill #9

## 2017-06-09 MED FILL — FLUTICASONE PROP 50 MCG SPR: 50 | 30 days supply | Qty: 16 | Fill #4

## 2017-07-09 MED FILL — AMLODIPINE BESYLATE 10 MG T: 10 | 30 days supply | Qty: 30 | Fill #10

## 2017-08-09 MED FILL — AMLODIPINE BESYLATE 10 MG T: 10 | 30 days supply | Qty: 30 | Fill #11

## 2017-09-14 ENCOUNTER — Other Ambulatory Visit: Payer: Self-pay | Admitting: Pharmacist

## 2017-09-14 DIAGNOSIS — I10 Essential (primary) hypertension: Secondary | ICD-10-CM

## 2017-09-14 DIAGNOSIS — Z23 Encounter for immunization: Secondary | ICD-10-CM

## 2017-09-14 DIAGNOSIS — J309 Allergic rhinitis, unspecified: Secondary | ICD-10-CM

## 2017-09-14 MED ORDER — FLUTICASONE PROPIONATE 50 MCG/ACT NA SUSP: 2.0000 | Freq: Every day | NASAL | 0 refills | Status: DC

## 2017-09-14 MED ORDER — AMLODIPINE BESYLATE 10 MG PO TABS: 10.0000 mg | ORAL_TABLET | Freq: Every day | ORAL | 0 refills | Status: DC

## 2017-09-14 MED FILL — AMLODIPINE BESYLATE 10 MG T: 10 | 30 days supply | Qty: 30 | Fill #0

## 2017-09-14 MED FILL — FLUTICASONE PROP 50 MCG SPR: 50 | 30 days supply | Qty: 16 | Fill #0

## 2017-10-13 MED FILL — AMLODIPINE BESYLATE 10 MG T: 10 | 30 days supply | Qty: 30 | Fill #0

## 2017-11-11 MED FILL — AMLODIPINE BESYLATE 10 MG T: 10 | 30 days supply | Qty: 30 | Fill #1

## 2017-12-13 MED FILL — AMLODIPINE BESYLATE 10 MG T: 10 | 30 days supply | Qty: 30 | Fill #2

## 2017-12-20 ENCOUNTER — Encounter: Payer: Self-pay | Admitting: Nurse Practitioner

## 2017-12-20 ENCOUNTER — Ambulatory Visit: Payer: Managed Care, Other (non HMO) | Attending: Nurse Practitioner | Admitting: Nurse Practitioner

## 2017-12-20 ENCOUNTER — Encounter

## 2017-12-20 VITALS — BP 124/86 | HR 72 | Temp 98.6°F | Ht 65.0 in | Wt 194.0 lb

## 2017-12-20 DIAGNOSIS — Z79899 Other long term (current) drug therapy: Secondary | ICD-10-CM | POA: Diagnosis not present

## 2017-12-20 DIAGNOSIS — S68119S Complete traumatic metacarpophalangeal amputation of unspecified finger, sequela: Secondary | ICD-10-CM

## 2017-12-20 DIAGNOSIS — Z8249 Family history of ischemic heart disease and other diseases of the circulatory system: Secondary | ICD-10-CM | POA: Diagnosis not present

## 2017-12-20 DIAGNOSIS — I10 Essential (primary) hypertension: Secondary | ICD-10-CM | POA: Diagnosis present

## 2017-12-20 DIAGNOSIS — Z1211 Encounter for screening for malignant neoplasm of colon: Secondary | ICD-10-CM | POA: Diagnosis not present

## 2017-12-20 DIAGNOSIS — E559 Vitamin D deficiency, unspecified: Secondary | ICD-10-CM | POA: Insufficient documentation

## 2017-12-20 DIAGNOSIS — Z8349 Family history of other endocrine, nutritional and metabolic diseases: Secondary | ICD-10-CM | POA: Diagnosis not present

## 2017-12-20 DIAGNOSIS — R195 Other fecal abnormalities: Secondary | ICD-10-CM | POA: Diagnosis not present

## 2017-12-20 DIAGNOSIS — N939 Abnormal uterine and vaginal bleeding, unspecified: Secondary | ICD-10-CM | POA: Diagnosis not present

## 2017-12-20 DIAGNOSIS — R102 Pelvic and perineal pain: Secondary | ICD-10-CM | POA: Diagnosis not present

## 2017-12-20 LAB — POCT URINALYSIS DIPSTICK
Bilirubin, UA: NEGATIVE
GLUCOSE UA: NEGATIVE
LEUKOCYTES UA: NEGATIVE
Nitrite, UA: NEGATIVE
Protein, UA: NEGATIVE
Spec Grav, UA: 1.015 (ref 1.010–1.025)
Urobilinogen, UA: 0.2 E.U./dL
pH, UA: 6 (ref 5.0–8.0)

## 2017-12-20 MED ORDER — AMLODIPINE BESYLATE 10 MG PO TABS: 10.0000 mg | ORAL_TABLET | Freq: Every day | ORAL | 1 refills | Status: DC

## 2017-12-20 NOTE — Patient Instructions (Signed)

## 2017-12-20 NOTE — Progress Notes (Signed)
Assessment & Plan:  Autumn Gomez was seen today for establish care.  Diagnoses and all orders for this visit:  Essential hypertension -     Urinalysis Dipstick -     amLODipine (NORVASC) 10 MG tablet; Take 1 tablet (10 mg total) by mouth daily. -     CBC -     CMP14+EGFR -     Lipid panel Continue all antihypertensives as prescribed.  Remember to bring in your blood pressure log with you for your follow up appointment.  DASH/Mediterranean Diets are healthier choices for HTN.    Vitamin D deficiency -     VITAMIN D 25 Hydroxy (Vit-D Deficiency, Fractures)  Abnormal uterine bleeding (AUB) Schedule PAP for next office visit.   Family history of thyroid disease in mother -     TSH  Colon cancer screening -     Fecal occult blood, imunochemical([facility])    Patient has been counseled on age-appropriate routine health concerns for screening and prevention. These are reviewed and up-to-date. Referrals have been placed accordingly. Immunizations are up-to-date or declined.    Subjective:   Chief Complaint  Patient presents with  . Establish Care    Pt. is here to establish care for hypertension.  Pt. stated she had a full period 2 weeks ago, and abdominal pain, and still spottting.    HPI Autumn Gomez 52 y.o. female presents to office today to establish care. She has a history of HTN and she has complaints today of AUB  Abnormal Uterine Bleeding Patient complains of menstrual symptoms. Symptoms began a few months ago. Patient describes symptoms of  pelvic pain (mild) and metrorrhagia. Symptoms occur erratically during the cycle. Patient denies labile mood and migraine headaches. Evaluation to date includes pelvic US (abnorma)l: IMPRESSION: 1. Markedly heterogeneous uterine echotexture and enlargement. Presumably related to uterine fibroids. 2. Hyperechoic right ovarian lesion, likely a dermoid. 3. "Mass" medial to the right ovary, which could represent an exophytic  right sided uterine fibroid. Suboptimally evaluated. 4. Localize left adnexal fluid versus hydrosalpinx. Pre and post contrast pelvic MRI may be useful for further characterization of the multiple above findings. Treatment to date includes nothing. The patient is not currently sexually active.    CHRONIC HYPERTENSION Disease Monitoring  Blood pressure range: Chronic. Well controlled.  BP Readings from Last 3 Encounters:  12/20/17 124/86  05/12/17 120/73  12/08/16 129/77    Chest pain: no   Dyspnea: no   Claudication: no  Medication compliance: yes; taking amlodipine 42m daily Medication Side Effects  Lightheadedness: no   Urinary frequency: no   Edema: no   Impotence: no  Preventitive Healthcare:  Exercise: no   Diet Pattern: salt not added to cooking  Salt Restriction:  no  Review of Systems  Constitutional: Negative for fever, malaise/fatigue and weight loss.  HENT: Negative.  Negative for nosebleeds.   Eyes: Negative.  Negative for blurred vision, double vision and photophobia.  Respiratory: Negative.  Negative for cough and shortness of breath.   Cardiovascular: Negative.  Negative for chest pain, palpitations and leg swelling.  Gastrointestinal: Negative.  Negative for heartburn, nausea and vomiting.  Genitourinary:       SEE HPI  Musculoskeletal: Negative.  Negative for myalgias.  Neurological: Negative.  Negative for dizziness, focal weakness, seizures and headaches.  Psychiatric/Behavioral: Negative.  Negative for suicidal ideas.    Past Medical History:  Diagnosis Date  . Abnormal Pap smear    repeat pap  . Anemia    as  child   . Hypertension     Past Surgical History:  Procedure Laterality Date  . BREAST EXCISIONAL BIOPSY    . 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 left breast cyst  Left 1986    Family History  Problem Relation Age of Onset  . Cancer Father        lung  . Hypertension Father   . Hypertension Mother   . Breast cancer  Maternal Aunt   . Breast cancer Cousin     Social History Reviewed with no changes to be made today.   Outpatient Medications Prior to Visit  Medication Sig Dispense Refill  . fluticasone (FLONASE) 50 MCG/ACT nasal spray Place 2 sprays into both nostrils daily. 16 g 0  . ketotifen (ZADITOR) 0.025 % ophthalmic solution Place 1 drop into both eyes 2 (two) times daily. 10 mL 1  . amLODipine (NORVASC) 10 MG tablet Take 1 tablet (10 mg total) by mouth daily. 30 tablet 0  . Elastic Bandages & Supports (MEDICAL COMPRESSION STOCKINGS) MISC APPLY TO BILATERAL LOWER LEGS FOR SUPPORT. TO BE FITTED BY MEDICAL SUPPLY. (Patient not taking: Reported on 12/20/2017) 2 each 0  . naproxen (NAPROSYN) 500 MG tablet TAKE ONE TABLET BY MOUTH WITH MEALS TWICE A DAY AS NEEDED FOR PAIN. (Patient not taking: Reported on 12/20/2017) 40 tablet 0  . Vitamin D, Ergocalciferol, (DRISDOL) 50000 units CAPS capsule Take 1 capsule (50,000 Units total) by mouth every 7 (seven) days. For 12 weeks (Patient not taking: Reported on 12/20/2017) 4 capsule 2   No facility-administered medications prior to visit.     Allergies  Allergen Reactions  . Keflex [Cephalexin]        Objective:    BP 124/86 (BP Location: Left Arm, Patient Position: Sitting, Cuff Size: Normal)   Pulse 72   Temp 98.6 F (37 C) (Oral)   Ht 5' 5"  (1.651 m)   Wt 194 lb (88 kg)   LMP 12/06/2017   SpO2 99%   BMI 32.28 kg/m  Wt Readings from Last 3 Encounters:  12/20/17 194 lb (88 kg)  05/12/17 193 lb 9.6 oz (87.8 kg)  12/08/16 193 lb 12.8 oz (87.9 kg)    Physical Exam  Constitutional: She is oriented to person, place, and time. She appears well-developed and well-nourished. She is cooperative.  HENT:  Head: Normocephalic and atraumatic.  Eyes: EOM are normal.  Neck: Normal range of motion.  Cardiovascular: Normal rate, regular rhythm and normal heart sounds. Exam reveals no gallop and no friction rub.  No murmur heard. Pulmonary/Chest: Effort  normal and breath sounds normal. No tachypnea. No respiratory distress. She has no decreased breath sounds. She has no wheezes. She has no rhonchi. She has no rales. She exhibits no tenderness.  Abdominal: Bowel sounds are normal.  Musculoskeletal: Normal range of motion. She exhibits no edema.  Neurological: She is alert and oriented to person, place, and time. Coordination normal.  Skin: Skin is warm and dry.  Psychiatric: She has a normal mood and affect. Her behavior is normal. Judgment and thought content normal.  Nursing note and vitals reviewed.      Patient has been counseled extensively about nutrition and exercise as well as the importance of adherence with medications and regular follow-up. The patient was given clear instructions to go to ER or return to medical center if symptoms don't improve, worsen or new problems develop. The patient verbalized understanding.   Follow-up: Return in about 6  months (around 06/22/2018) for HTN.   Gildardo Pounds, FNP-BC [facility] and [facility] [place], [place]   12/26/2017, 12:06 AM

## 2017-12-21 LAB — LIPID PANEL
Chol/HDL Ratio: 2.9 ratio (ref 0.0–4.4)
Cholesterol, Total: 232 mg/dL — ABNORMAL HIGH (ref 100–199)
HDL: 81 mg/dL (ref >39)
LDL Calculated: 129 mg/dL — ABNORMAL HIGH (ref 0–99)
Triglycerides: 109 mg/dL (ref 0–149)
VLDL Cholesterol Cal: 22 mg/dL (ref 5–40)

## 2017-12-21 LAB — CMP14+EGFR
A/G RATIO: 1.5 (ref 1.2–2.2)
ALT: 18 IU/L (ref 0–32)
AST: 26 IU/L (ref 0–40)
Albumin: 4.2 g/dL (ref 3.5–5.5)
Alkaline Phosphatase: 72 IU/L (ref 39–117)
BUN / CREAT RATIO: 14 (ref 9–23)
BUN: 14 mg/dL (ref 6–24)
Bilirubin Total: <0.2 mg/dL (ref 0.0–1.2)
CHLORIDE: 100 mmol/L (ref 96–106)
CO2: 21 mmol/L (ref 20–29)
Calcium: 9.4 mg/dL (ref 8.7–10.2)
Creatinine, Ser: 0.98 mg/dL (ref 0.57–1.00)
GFR calc non Af Amer: 67 mL/min/{1.73_m2} (ref >59)
GFR, EST AFRICAN AMERICAN: 77 mL/min/{1.73_m2} (ref >59)
GLUCOSE: 74 mg/dL (ref 65–99)
Globulin, Total: 2.8 g/dL (ref 1.5–4.5)
POTASSIUM: 4.2 mmol/L (ref 3.5–5.2)
SODIUM: 140 mmol/L (ref 134–144)
TOTAL PROTEIN: 7 g/dL (ref 6.0–8.5)

## 2017-12-21 LAB — TSH: TSH: 0.621 u[IU]/mL (ref 0.450–4.500)

## 2017-12-21 LAB — CBC
Hematocrit: 31.7 % — ABNORMAL LOW (ref 34.0–46.6)
Hemoglobin: 10.4 g/dL — ABNORMAL LOW (ref 11.1–15.9)
MCH: 29.2 pg (ref 26.6–33.0)
MCHC: 32.8 g/dL (ref 31.5–35.7)
MCV: 89 fL (ref 79–97)
PLATELETS: 358 10*3/uL (ref 150–450)
RBC: 3.56 x10E6/uL — AB (ref 3.77–5.28)
RDW: 14.7 % (ref 12.3–15.4)
WBC: 6.7 10*3/uL (ref 3.4–10.8)

## 2017-12-21 LAB — VITAMIN D 25 HYDROXY (VIT D DEFICIENCY, FRACTURES): Vit D, 25-Hydroxy: 10.8 ng/mL — ABNORMAL LOW (ref 30.0–100.0)

## 2017-12-26 ENCOUNTER — Encounter: Payer: Self-pay | Admitting: Nurse Practitioner

## 2017-12-26 MED ORDER — VITAMIN D (ERGOCALCIFEROL) 1.25 MG (50000 UNIT) PO CAPS
50000.0000 [IU] | ORAL_CAPSULE | ORAL | 1 refills | Status: DC
Start: 2017-12-26 — End: 2019-02-07

## 2018-01-10 MED FILL — AMLODIPINE BESYLATE 10 MG T: 10 | 30 days supply | Qty: 30 | Fill #3

## 2018-01-11 MED FILL — VIT D2 1.25 MG (50,000 UNIT: 1.25 MG | 28 days supply | Qty: 4 | Fill #0

## 2018-02-09 MED FILL — AMLODIPINE BESYLATE 10 MG T: 10 | 30 days supply | Qty: 30 | Fill #4

## 2018-02-14 ENCOUNTER — Other Ambulatory Visit: Payer: Self-pay

## 2018-02-14 DIAGNOSIS — J309 Allergic rhinitis, unspecified: Secondary | ICD-10-CM

## 2018-02-14 DIAGNOSIS — Z23 Encounter for immunization: Secondary | ICD-10-CM

## 2018-02-14 MED ORDER — FLUTICASONE PROPIONATE 50 MCG/ACT NA SUSP: 2.0000 | Freq: Every day | NASAL | 2 refills | Status: DC

## 2018-03-10 MED FILL — AMLODIPINE BESYLATE 10 MG T: 10 | 30 days supply | Qty: 30 | Fill #5

## 2018-04-11 MED FILL — AMLODIPINE BESYLATE 10 MG T: 10 | 30 days supply | Qty: 30 | Fill #0

## 2018-04-14 ENCOUNTER — Other Ambulatory Visit: Payer: Self-pay | Admitting: Family Medicine

## 2018-04-14 DIAGNOSIS — Z1231 Encounter for screening mammogram for malignant neoplasm of breast: Secondary | ICD-10-CM

## 2018-05-10 MED FILL — AMLODIPINE BESYLATE 10 MG T: 10 | 30 days supply | Qty: 30 | Fill #1

## 2018-05-11 MED FILL — AMOXICILLIN 875 MG TABS: 875 | 7 days supply | Qty: 14 | Fill #0

## 2018-05-24 ENCOUNTER — Ambulatory Visit
Admission: RE | Admit: 2018-05-24 | Discharge: 2018-05-24 | Disposition: A | Discharge status: Home / Self Care | Payer: Managed Care, Other (non HMO) | Source: Ambulatory Visit | Attending: Family Medicine | Admitting: Family Medicine

## 2018-05-24 DIAGNOSIS — Z1231 Encounter for screening mammogram for malignant neoplasm of breast: Secondary | ICD-10-CM

## 2018-06-08 MED FILL — AMLODIPINE BESYLATE 10 MG T: 10 | 30 days supply | Qty: 30 | Fill #2

## 2018-07-11 MED FILL — AMLODIPINE BESYLATE 10 MG T: 10 | 30 days supply | Qty: 30 | Fill #3

## 2018-08-09 MED FILL — AMLODIPINE BESYLATE 10 MG T: 10 | 30 days supply | Qty: 30 | Fill #4

## 2018-09-07 MED FILL — AMLODIPINE BESYLATE 10 MG T: 10 | 30 days supply | Qty: 30 | Fill #5

## 2018-09-29 ENCOUNTER — Other Ambulatory Visit: Payer: Self-pay | Admitting: Nurse Practitioner

## 2018-09-29 DIAGNOSIS — I10 Essential (primary) hypertension: Secondary | ICD-10-CM

## 2018-10-03 ENCOUNTER — Other Ambulatory Visit: Payer: Self-pay | Admitting: Nurse Practitioner

## 2018-10-03 DIAGNOSIS — I10 Essential (primary) hypertension: Secondary | ICD-10-CM

## 2018-10-03 MED FILL — AMLODIPINE BESYLATE 10 MG T: 10 | 30 days supply | Qty: 30 | Fill #0

## 2018-10-04 MED FILL — FLUTICASONE PROP 50 MCG SPR: 50 | 30 days supply | Qty: 16 | Fill #0

## 2018-11-01 MED FILL — AMLODIPINE BESYLATE 10 MG T: 10 | 30 days supply | Qty: 30 | Fill #1

## 2018-12-02 MED FILL — AMLODIPINE BESYLATE 10 MG T: 10 | 30 days supply | Qty: 30 | Fill #2

## 2018-12-27 ENCOUNTER — Telehealth (HOSPITAL_BASED_OUTPATIENT_CLINIC_OR_DEPARTMENT_OTHER): Payer: Managed Care, Other (non HMO) | Admitting: Family Medicine

## 2018-12-27 ENCOUNTER — Encounter: Payer: Self-pay | Admitting: Family Medicine

## 2018-12-27 ENCOUNTER — Other Ambulatory Visit: Payer: Self-pay

## 2018-12-27 DIAGNOSIS — M25512 Pain in left shoulder: Secondary | ICD-10-CM

## 2018-12-27 MED ORDER — CYCLOBENZAPRINE HCL 10 MG PO TABS: 10.0000 mg | ORAL_TABLET | Freq: Every day | ORAL | 0 refills | Status: DC

## 2018-12-27 MED FILL — CYCLOBENZAPRINE 10 MG TAB: 10 | 30 days supply | Qty: 30 | Fill #0

## 2018-12-27 NOTE — Progress Notes (Signed)
Patient has been called and DOB has been verified. Patient has been screened and transferred to PCP to start phone visit.     

## 2018-12-27 NOTE — Progress Notes (Signed)
Virtual Visit via Video Note  I connected with Kathlen Mody, on 12/27/2018 at 10:08 AM by video enabled telemedicine device due to the COVID-19 pandemic and verified that I am speaking with the correct person using two identifiers.   Consent: I discussed the limitations, risks, security and privacy concerns of performing an evaluation and management service by telemedicine and the availability of in person appointments. I also discussed with the patient that there may be a patient responsible charge related to this service. The patient expressed understanding and agreed to proceed.   Location of Patient: Home  Location of Provider: Clinic   Persons participating in Telemedicine visit: Shakelia Scrivner Farrington-CMA Dr. Felecia Shelling     History of Present Illness: 53 year old right-handed female with a history of hypertension who is seen today for an acute visit complaining of left shoulder pain which was initially severe and now is rated at 5/10.  Pain is located on the posterior aspect of her left shoulder and she is right-handed.  Pain does not radiate down her shoulder, is described as burning and initially she did have reduced left arm strength which is now back to baseline.  Denies numbness in her left arm. 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  Application of ice and Tylenol have been beneficial. She would need a note to return to work.   Past Medical History:  Diagnosis Date  . Abnormal Pap smear    repeat pap  . Anemia    as child   . Hypertension    Allergies  Allergen Reactions  . Keflex [Cephalexin]     Current Outpatient Medications on File Prior to Visit  Medication Sig Dispense Refill  . amLODipine (NORVASC) 10 MG tablet TAKE 1 TABLET BY MOUTH DAILY. 90 tablet 0  . fluticasone (FLONASE) 50 MCG/ACT nasal spray Place 2 sprays into both nostrils daily. 16 g 2  . ketotifen (ZADITOR)  0.025 % ophthalmic solution Place 1 drop into both eyes 2 (two) times daily. (Patient not taking: Reported on 12/27/2018) 10 mL 1  . Vitamin D, Ergocalciferol, (DRISDOL) 50000 units CAPS capsule Take 1 capsule (50,000 Units total) by mouth every 7 (seven) days. For 12 weeks (Patient not taking: Reported on 12/27/2018) 12 capsule 1   No current facility-administered medications on file prior to visit.     Observations/Objective: Awake, alert, oriented x3 Not in acute distress Virtual exam reveals - No edema, full range of motion of the upper extremity.  Assessment and Plan: 1. Acute pain of left shoulder Advised to apply ice Tylenol has been beneficial advised to continue with this Advised of sedating side effects of Flexeril which she will take at night Work note provided - cyclobenzaprine (FLEXERIL) 10 MG tablet; Take 1 tablet (10 mg total) by mouth at bedtime.  Dispense: 30 tablet; Refill: 0   Follow Up Instructions: Keep follow-up appointment with PCP for chronic medical conditions   I discussed the assessment and treatment plan with the patient. The patient was provided an opportunity to ask questions and all were answered. The patient agreed with the plan and demonstrated an understanding of the instructions.   The patient was advised to call back or seek an in-person evaluation if the symptoms worsen or if the condition fails to improve as anticipated.     I provided 15 minutes total of Telehealth time during this encounter including median intraservice time, reviewing previous notes, labs,  imaging, medications and explaining diagnosis and management.     Charlott Rakes, MD, FAAFP. [facility], [place]   12/27/2018, 10:08 AM

## 2019-01-04 ENCOUNTER — Ambulatory Visit (HOSPITAL_BASED_OUTPATIENT_CLINIC_OR_DEPARTMENT_OTHER): Payer: Managed Care, Other (non HMO) | Admitting: Nurse Practitioner

## 2019-01-04 ENCOUNTER — Encounter: Payer: Self-pay | Admitting: Nurse Practitioner

## 2019-01-04 ENCOUNTER — Other Ambulatory Visit: Payer: Self-pay

## 2019-01-04 ENCOUNTER — Ambulatory Visit (HOSPITAL_COMMUNITY)
Admission: RE | Admit: 2019-01-04 | Discharge: 2019-01-04 | Disposition: A | Discharge status: Home / Self Care | Payer: Managed Care, Other (non HMO) | Source: Ambulatory Visit | Attending: Nurse Practitioner | Admitting: Nurse Practitioner

## 2019-01-04 ENCOUNTER — Other Ambulatory Visit: Payer: Self-pay | Admitting: Nurse Practitioner

## 2019-01-04 VITALS — BP 118/74 | HR 81 | Temp 98.6°F | Ht 65.0 in | Wt 198.0 lb

## 2019-01-04 DIAGNOSIS — M20001 Unspecified deformity of right finger(s): Secondary | ICD-10-CM

## 2019-01-04 DIAGNOSIS — Z1211 Encounter for screening for malignant neoplasm of colon: Secondary | ICD-10-CM

## 2019-01-04 DIAGNOSIS — M20002 Unspecified deformity of left finger(s): Secondary | ICD-10-CM

## 2019-01-04 DIAGNOSIS — S63105D Unspecified dislocation of left thumb, subsequent encounter: Secondary | ICD-10-CM

## 2019-01-04 DIAGNOSIS — S68119S Complete traumatic metacarpophalangeal amputation of unspecified finger, sequela: Secondary | ICD-10-CM

## 2019-01-04 DIAGNOSIS — R221 Localized swelling, mass and lump, neck: Secondary | ICD-10-CM

## 2019-01-04 DIAGNOSIS — S68119A Complete traumatic metacarpophalangeal amputation of unspecified finger, initial encounter: Secondary | ICD-10-CM | POA: Insufficient documentation

## 2019-01-04 DIAGNOSIS — I10 Essential (primary) hypertension: Secondary | ICD-10-CM

## 2019-01-04 DIAGNOSIS — Z0000 Encounter for general adult medical examination without abnormal findings: Secondary | ICD-10-CM

## 2019-01-04 DIAGNOSIS — E559 Vitamin D deficiency, unspecified: Secondary | ICD-10-CM

## 2019-01-04 DIAGNOSIS — E782 Mixed hyperlipidemia: Secondary | ICD-10-CM

## 2019-01-04 DIAGNOSIS — Z8349 Family history of other endocrine, nutritional and metabolic diseases: Secondary | ICD-10-CM

## 2019-01-04 DIAGNOSIS — D649 Anemia, unspecified: Secondary | ICD-10-CM

## 2019-01-04 MED ORDER — AMLODIPINE BESYLATE 10 MG PO TABS: 10.0000 mg | ORAL_TABLET | Freq: Every day | ORAL | 1 refills | Status: DC

## 2019-01-04 MED FILL — AMLODIPINE BESYLATE 10 MG T: 10 | 30 days supply | Qty: 30 | Fill #0

## 2019-01-04 NOTE — Progress Notes (Addendum)
Assessment & Plan:  Autumn Gomez was seen today for annual exam.  Diagnoses and all orders for this visit:  Encounter for annual physical exam  Colon cancer screening -     Ambulatory referral to Gastroenterology  Mixed hyperlipidemia -     Lipid panel  Vitamin D deficiency disease -     VITAMIN D 25 Hydroxy (Vit-D Deficiency, Fractures)  Anemia, unspecified type -     CBC  Hypocalcemia -     Basic metabolic panel  Deformity of joint of thumb, right -     DG Finger Thumb Left; Future  Fullness of neck -     US THYROID; Future  Family history of thyroid disease in mother -     US THYROID; Future -     TSH  Neck fullness -     US THYROID; Future -     TSH  Essential hypertension -     amLODipine (NORVASC) 10 MG tablet; Take 1 tablet (10 mg total) by mouth daily.  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 These are reviewed and up-to-date. Referrals have been placed accordingly. Immunizations are up-to-date or declined.    Subjective:   Chief Complaint  Patient presents with   Annual Exam    Patient is here for a physical.    HPI Autumn Gomez 53 y.o. female presents to office today   Review of Systems  Constitutional: Negative.  Negative for chills, fever, malaise/fatigue and weight loss.  HENT: Negative.  Negative for congestion, hearing loss, sinus pain and sore throat.   Eyes: Negative.  Negative for blurred vision, double vision, photophobia and pain.  Respiratory: Negative.  Negative for cough, sputum production, shortness of breath and wheezing.   Cardiovascular: Negative.  Negative for chest pain and leg swelling.  Gastrointestinal: Negative.  Negative for abdominal pain, constipation, diarrhea, heartburn, nausea and vomiting.  Genitourinary: Negative.   Musculoskeletal: Positive for joint pain. Negative for myalgias.  Skin:  Negative.  Negative for rash.  Neurological: Negative.  Negative for dizziness, tremors, speech change, focal weakness, seizures and headaches.  Endo/Heme/Allergies: Negative.  Negative for environmental allergies.  Psychiatric/Behavioral: Negative.  Negative for depression and suicidal ideas. The patient is not nervous/anxious and does not have insomnia.     Past Medical History:  Diagnosis Date   Abnormal Pap smear    repeat pap   Anemia    as child    Hypertension     Past Surgical History:  Procedure Laterality Date   BREAST EXCISIONAL BIOPSY Left    finger amputee Left 2013    1st finger    left breast cyst  Left 1986    Family History  Problem Relation Age of Onset   Cancer Father        lung   Hypertension Father    Hypertension Mother    Breast cancer Maternal Aunt    Breast cancer Cousin     Social History Reviewed with no changes to be made today.   Outpatient Medications Prior to Visit  Medication Sig Dispense Refill   cyclobenzaprine (FLEXERIL) 10 MG tablet Take 1 tablet (10 mg total) by mouth at bedtime. 30 tablet 0   fluticasone (FLONASE) 50 MCG/ACT nasal spray Place 2 sprays into both nostrils daily. 16 g 2   amLODipine (NORVASC) 10 MG tablet TAKE 1 TABLET BY MOUTH DAILY. 90 tablet 0  ketotifen (ZADITOR) 0.025 % ophthalmic solution Place 1 drop into both eyes 2 (two) times daily. 10 mL 1   Vitamin D, Ergocalciferol, (DRISDOL) 50000 units CAPS capsule Take 1 capsule (50,000 Units total) by mouth every 7 (seven) days. For 12 weeks (Patient not taking: Reported on 12/27/2018) 12 capsule 1   No facility-administered medications prior to visit.     Allergies  Allergen Reactions   Keflex [Cephalexin]        Objective:    BP 118/74 (BP Location: Left Arm, Patient Position: Sitting, Cuff Size: Normal)    Pulse 81    Temp 98.6 F (37 C) (Oral)    Ht 5\' 5"  (1.651 m)    Wt 198 lb (89.8 kg)    SpO2 100%    BMI 32.95 kg/m  Wt Readings from  Last 3 Encounters:  01/04/19 198 lb (89.8 kg)  12/20/17 194 lb (88 kg)  05/12/17 193 lb 9.6 oz (87.8 kg)    Physical Exam Constitutional:      Appearance: She is well-developed.  HENT:     Head: Normocephalic and atraumatic.     Right Ear: Hearing, tympanic membrane, ear canal and external ear normal.     Left Ear: Hearing, tympanic membrane, ear canal and external ear normal.     Nose: Nose normal.     Right Turbinates: Swollen and pale.     Left Turbinates: Swollen and pale.     Mouth/Throat:     Dentition: Abnormal dentition.     Tongue: No lesions. Tongue does not deviate from midline.     Pharynx: No oropharyngeal exudate.  Eyes:     General: Lids are normal. Lids are everted, no foreign bodies appreciated. Vision grossly intact. Gaze aligned appropriately. No scleral icterus.       Right eye: No discharge.        Left eye: No discharge.     Extraocular Movements: Extraocular movements intact.     Conjunctiva/sclera: Conjunctivae normal.     Pupils: Pupils are equal, round, and reactive to light.     Visual Fields: Right eye visual fields normal and left eye visual fields normal.  Neck:     Musculoskeletal: Normal range of motion and neck supple.     Thyroid: Thyromegaly present.     Trachea: No tracheal deviation.  Cardiovascular:     Rate and Rhythm: Normal rate and regular rhythm.     Pulses:          Dorsalis pedis pulses are 2+ on the right side and 2+ on the left side.       Posterior tibial pulses are 2+ on the right side and 2+ on the left side.     Heart sounds: Normal heart sounds. No murmur. No friction rub.  Pulmonary:     Effort: Pulmonary effort is normal. No accessory muscle usage or respiratory distress.     Breath sounds: Normal breath sounds. No decreased breath sounds, wheezing, rhonchi or rales.  Chest:     Chest wall: No tenderness.     Breasts: Breasts are symmetrical.        Right: Normal. No inverted nipple, mass, nipple discharge, skin change  or tenderness.        Left: Normal. No inverted nipple, mass, nipple discharge, skin change or tenderness.  Abdominal:     General: Bowel sounds are normal. There is no distension.     Palpations: Abdomen is soft. There is no mass.  Tenderness: There is no abdominal tenderness. There is no guarding or rebound.  Musculoskeletal: Normal range of motion.        General: No deformity.     Left hand: She exhibits tenderness.       Hands:  Feet:     Right foot:     Protective Sensation: 10 sites tested. 10 sites sensed.     Skin integrity: Skin integrity normal.     Left foot:     Protective Sensation: 10 sites tested. 10 sites sensed.     Skin integrity: Skin integrity normal.  Lymphadenopathy:     Cervical: No cervical adenopathy.     Right cervical: No superficial, deep or posterior cervical adenopathy.    Left cervical: No superficial, deep or posterior cervical adenopathy.  Skin:    General: Skin is warm and dry.     Findings: No erythema.  Neurological:     Mental Status: She is alert and oriented to person, place, and time.     Cranial Nerves: No cranial nerve deficit.     Coordination: Coordination normal.     Deep Tendon Reflexes: Reflexes are normal and symmetric.  Psychiatric:        Speech: Speech normal.        Behavior: Behavior normal.        Thought Content: Thought content normal.        Judgment: Judgment normal.          Patient has been counseled extensively about nutrition and exercise as well as the importance of adherence with medications and regular follow-up. The patient was given clear instructions to go to ER or return to medical center if symptoms don't improve, worsen or new problems develop. The patient verbalized understanding.   Follow-up: Return in about 6 months (around 07/07/2019) for HTN.   Gildardo Pounds, FNP-BC [facility] and [facility], [place]   01/04/2019, 12:25 PM

## 2019-01-05 LAB — BASIC METABOLIC PANEL
BUN/Creatinine Ratio: 14 (ref 9–23)
BUN: 13 mg/dL (ref 6–24)
CO2: 20 mmol/L (ref 20–29)
Calcium: 9.1 mg/dL (ref 8.7–10.2)
Chloride: 105 mmol/L (ref 96–106)
Creatinine, Ser: 0.95 mg/dL (ref 0.57–1.00)
GFR calc Af Amer: 80 mL/min/{1.73_m2} (ref >59)
GFR calc non Af Amer: 69 mL/min/{1.73_m2} (ref >59)
Glucose: 91 mg/dL (ref 65–99)
Potassium: 4.2 mmol/L (ref 3.5–5.2)
Sodium: 139 mmol/L (ref 134–144)

## 2019-01-05 LAB — LIPID PANEL
Chol/HDL Ratio: 3.1 ratio (ref 0.0–4.4)
Cholesterol, Total: 242 mg/dL — ABNORMAL HIGH (ref 100–199)
HDL: 79 mg/dL (ref >39)
LDL Calculated: 139 mg/dL — ABNORMAL HIGH (ref 0–99)
Triglycerides: 119 mg/dL (ref 0–149)
VLDL Cholesterol Cal: 24 mg/dL (ref 5–40)

## 2019-01-05 LAB — CBC
Hematocrit: 34.1 % (ref 34.0–46.6)
Hemoglobin: 10.4 g/dL — ABNORMAL LOW (ref 11.1–15.9)
MCH: 27.9 pg (ref 26.6–33.0)
MCHC: 30.5 g/dL — ABNORMAL LOW (ref 31.5–35.7)
MCV: 91 fL (ref 79–97)
Platelets: 348 10*3/uL (ref 150–450)
RBC: 3.73 x10E6/uL — ABNORMAL LOW (ref 3.77–5.28)
RDW: 14.4 % (ref 11.7–15.4)
WBC: 6.2 10*3/uL (ref 3.4–10.8)

## 2019-01-05 LAB — VITAMIN D 25 HYDROXY (VIT D DEFICIENCY, FRACTURES): Vit D, 25-Hydroxy: 11.5 ng/mL — ABNORMAL LOW (ref 30.0–100.0)

## 2019-01-05 LAB — TSH: TSH: 0.458 u[IU]/mL (ref 0.450–4.500)

## 2019-01-07 ENCOUNTER — Encounter: Payer: Self-pay | Admitting: Nurse Practitioner

## 2019-01-11 ENCOUNTER — Encounter: Payer: Self-pay | Admitting: Orthopaedic Surgery

## 2019-01-11 ENCOUNTER — Other Ambulatory Visit: Payer: Self-pay

## 2019-01-11 ENCOUNTER — Ambulatory Visit (HOSPITAL_COMMUNITY)
Admission: RE | Admit: 2019-01-11 | Discharge: 2019-01-11 | Disposition: A | Discharge status: Home / Self Care | Payer: Managed Care, Other (non HMO) | Source: Ambulatory Visit | Attending: Nurse Practitioner | Admitting: Nurse Practitioner

## 2019-01-11 ENCOUNTER — Ambulatory Visit (INDEPENDENT_AMBULATORY_CARE_PROVIDER_SITE_OTHER): Payer: Managed Care, Other (non HMO) | Admitting: Orthopaedic Surgery

## 2019-01-11 DIAGNOSIS — M79645 Pain in left finger(s): Secondary | ICD-10-CM

## 2019-01-11 DIAGNOSIS — Z8349 Family history of other endocrine, nutritional and metabolic diseases: Secondary | ICD-10-CM

## 2019-01-11 DIAGNOSIS — R221 Localized swelling, mass and lump, neck: Secondary | ICD-10-CM | POA: Insufficient documentation

## 2019-01-11 NOTE — Progress Notes (Signed)
Office Visit Note   Patient: Autumn Gomez           Date of Birth: 1965/12/29           MRN: [id number] Visit Date: 01/11/2019              Requested by: Gildardo Pounds, NP [place],  [place] [id number] PCP: Gildardo Pounds, NP   Assessment & Plan: Visit Diagnoses:  1. 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  Voltaren gel which she can buy off-the-shelf.  Also she can take tumeric.  Would not recommend any type of surgical intervention due to the fact that she has good range of motion of the thumb and does not suffer from any sensation of instability of the IP joint.  Questions encouraged and answered at length.  Follow-up as needed.  Follow-Up Instructions: Return if symptoms worsen or fail to improve.   Orders:  No orders of the defined types were placed in this encounter.  No orders of the defined types were placed in this encounter.     Procedures: No procedures performed   Clinical Data: No additional findings.   Subjective: Chief Complaint  Patient presents with  . Left Thumb - Pain    HPI Oma is a 53 year old female comes in today for left thumb pain and swelling that is been ongoing for a couple of months.  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  Says she lives with the hands often is unsure if she hurt the thumb at work.  She has had no treatment for this.  She has had a previous injury to her left hand which involved her index and long finger having partial amputation of the distal phalanx of the long finger and reconstruction surgery of the long finger due to an injury slamming hand in the door.  Radiographs on epic dated 01/04/2019 left thumb shows first interphalangeal joint degenerative changes with mild radial subluxation of the distal phalanx.  Calcifications and heterotopic bone formation noted.  No acute fracture. Review of  Systems See HPI  Objective: Vital Signs: There were no vitals taken for this visit.  Physical Exam Constitutional:      Appearance: She is not ill-appearing or diaphoretic.  Pulmonary:     Effort: Pulmonary effort is normal.  Neurological:     Mental Status: She is oriented to person, place, and time.  Psychiatric:        Mood and Affect: Mood normal.        Behavior: Behavior normal.     Ortho Exam Left hand good range of motion fingers.  Amputation index finger distal phalanx well-healed surgical incision.  Full range of motion of the left thumb through the IP joint.  There is obvious edema about the left thumb.  Minimal tenderness over the IP joint left thumb.  Radial pulses intact. Specialty Comments:  No specialty comments available.  Imaging: No results found.   PMFS History: Patient Active Problem List   Diagnosis Date Noted  . 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 Amputation of finger of left hand 09/07/2016  . Mild anemia 12/17/2014  . Vitamin D deficiency 12/17/2014  . Essential hypertension 12/12/2014  . Tinea corporis 12/12/2014  . Allergic rhinitis 12/12/2014  .  Fibroid uterus 12/06/2013  . Obesity (BMI 30-39.9) 10/09/2013   Past Medical History:  Diagnosis Date  . Abnormal Pap smear    repeat pap  . Anemia    as child   . Hypertension     Family History  Problem Relation Age of Onset  . Cancer Father        lung  . Hypertension Father   . Hypertension Mother   . Breast cancer Maternal Aunt   . Breast cancer Cousin     Past Surgical History:  Procedure Laterality Date  . BREAST EXCISIONAL BIOPSY Left   . 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 left breast cyst  Left 1986   Social History   Occupational History  . Occupation: Early Childhood education    Employer: [employer]    Comment: part time, loader, manual work   Tobacco Use  . Smoking  status: Former Research scientist (life sciences)  . Smokeless tobacco: Never Used  Substance and Sexual Activity  . Alcohol use: Yes    Comment: rarely  . Drug use: Yes    Types: Marijuana  . Sexual activity: Yes    Birth control/protection: None

## 2019-02-02 MED FILL — AMLODIPINE BESYLATE 10 MG T: 10 | 30 days supply | Qty: 30 | Fill #1

## 2019-02-06 ENCOUNTER — Encounter: Payer: Self-pay | Admitting: Nurse Practitioner

## 2019-02-07 ENCOUNTER — Other Ambulatory Visit: Payer: Self-pay | Admitting: Nurse Practitioner

## 2019-02-07 DIAGNOSIS — E559 Vitamin D deficiency, unspecified: Secondary | ICD-10-CM

## 2019-03-01 ENCOUNTER — Other Ambulatory Visit: Payer: Self-pay | Admitting: Nurse Practitioner

## 2019-03-01 DIAGNOSIS — Z23 Encounter for immunization: Secondary | ICD-10-CM

## 2019-03-01 DIAGNOSIS — E559 Vitamin D deficiency, unspecified: Secondary | ICD-10-CM

## 2019-03-01 DIAGNOSIS — J309 Allergic rhinitis, unspecified: Secondary | ICD-10-CM

## 2019-03-01 MED FILL — FLUTICASONE PROP 50 MCG SPR: 50 | 30 days supply | Qty: 16 | Fill #0

## 2019-03-01 MED FILL — PENICILLIN VK 500 MG TABLET: 500 | 10 days supply | Qty: 40 | Fill #0

## 2019-03-01 MED FILL — ACETAMINOPHEN/COD #3 TABLET: 300-30 | 5 days supply | Qty: 21 | Fill #0

## 2019-03-01 MED FILL — VIT D2 1.25 MG (50,000 UNIT: 1.25 MG | 30 days supply | Qty: 4 | Fill #0

## 2019-03-01 MED FILL — AMLODIPINE BESYLATE 10 MG T: 10 | 30 days supply | Qty: 30 | Fill #2

## 2019-03-13 MED FILL — FLUTICASONE PROP 50 MCG SPR: 50 | 30 days supply | Qty: 16 | Fill #0

## 2019-03-13 MED FILL — VIT D2 1.25 MG (50,000 UNIT: 1.25 MG | 28 days supply | Qty: 4 | Fill #0

## 2019-03-22 MED FILL — PENICILLIN VK 500 MG TABLET: 500 | 10 days supply | Qty: 40 | Fill #0

## 2019-04-05 MED FILL — AMLODIPINE BESYLATE 10 MG T: 10 | 30 days supply | Qty: 30 | Fill #3

## 2019-04-05 MED FILL — PENICILLIN VK 500 MG TABLET: 500 | 10 days supply | Qty: 40 | Fill #0

## 2019-04-05 MED FILL — ACETAMINOPHEN/COD #3 TABLET: 300-30 | 5 days supply | Qty: 21 | Fill #0

## 2019-04-26 MED FILL — PENICILLIN VK 500 MG TABLET: 500 | 10 days supply | Qty: 40 | Fill #0

## 2019-05-10 MED FILL — AMLODIPINE BESYLATE 10 MG T: 10 | 30 days supply | Qty: 30 | Fill #4

## 2019-05-29 ENCOUNTER — Other Ambulatory Visit: Payer: Self-pay

## 2019-05-29 DIAGNOSIS — Z20822 Contact with and (suspected) exposure to covid-19: Secondary | ICD-10-CM

## 2019-05-31 LAB — NOVEL CORONAVIRUS, NAA: SARS-CoV-2, NAA: NOT DETECTED

## 2019-06-06 MED FILL — AMLODIPINE BESYLATE 10 MG T: 10 | 30 days supply | Qty: 30 | Fill #5

## 2019-06-08 ENCOUNTER — Ambulatory Visit: Payer: Managed Care, Other (non HMO) | Attending: Nurse Practitioner

## 2019-07-05 ENCOUNTER — Other Ambulatory Visit: Payer: Self-pay | Admitting: Nurse Practitioner

## 2019-07-05 DIAGNOSIS — I10 Essential (primary) hypertension: Secondary | ICD-10-CM

## 2019-07-06 MED FILL — AMLODIPINE BESYLATE 10 MG T: 10 | 30 days supply | Qty: 30 | Fill #0

## 2019-07-17 ENCOUNTER — Ambulatory Visit: Payer: Managed Care, Other (non HMO) | Attending: Nurse Practitioner | Admitting: Nurse Practitioner

## 2019-07-26 ENCOUNTER — Encounter: Payer: Self-pay | Admitting: Nurse Practitioner

## 2019-07-26 ENCOUNTER — Encounter: Payer: Self-pay | Admitting: Gastroenterology

## 2019-07-26 ENCOUNTER — Other Ambulatory Visit: Payer: Self-pay

## 2019-07-26 ENCOUNTER — Ambulatory Visit: Payer: Managed Care, Other (non HMO) | Attending: Nurse Practitioner | Admitting: Nurse Practitioner

## 2019-07-26 DIAGNOSIS — D649 Anemia, unspecified: Secondary | ICD-10-CM

## 2019-07-26 DIAGNOSIS — E785 Hyperlipidemia, unspecified: Secondary | ICD-10-CM

## 2019-07-26 DIAGNOSIS — Z8349 Family history of other endocrine, nutritional and metabolic diseases: Secondary | ICD-10-CM

## 2019-07-26 DIAGNOSIS — Z1211 Encounter for screening for malignant neoplasm of colon: Secondary | ICD-10-CM

## 2019-07-26 DIAGNOSIS — N938 Other specified abnormal uterine and vaginal bleeding: Secondary | ICD-10-CM

## 2019-07-26 DIAGNOSIS — N939 Abnormal uterine and vaginal bleeding, unspecified: Secondary | ICD-10-CM

## 2019-07-26 DIAGNOSIS — E559 Vitamin D deficiency, unspecified: Secondary | ICD-10-CM | POA: Diagnosis not present

## 2019-07-26 DIAGNOSIS — I10 Essential (primary) hypertension: Secondary | ICD-10-CM

## 2019-07-26 NOTE — Progress Notes (Signed)
Virtual Visit via Telephone Note Due to national recommendations of social distancing due to [place] 19, telehealth visit is felt to be most appropriate for this patient at this time.  I discussed the limitations, risks, security and privacy concerns of performing an evaluation and management service by telephone and the availability of in person appointments. I also discussed with the patient that there may be a patient responsible charge related to this service. The patient expressed understanding and agreed to proceed.    I connected with Kathlen Mody on 07/26/19  at   9:30 AM EST  EDT by telephone and verified that I am speaking with the correct person using two identifiers.   Consent I discussed the limitations, risks, security and privacy concerns of performing an evaluation and management service by telephone and the availability of in person appointments. I also discussed with the patient that there may be a patient responsible charge related to this service. The patient expressed understanding and agreed to proceed.   Location of Patient: Private Residence   Location of Provider: [place] and [place] participating in Telemedicine visit: Geryl Rankins FNP-BC [place]    History of Present Illness: Telemedicine visit for: AUB   States she hasn't had her menstrual cycle since November. She has been sexually active but not recently.  Wondering if she is going through menopause. Last menstrual cycle was Thanksgiving.  Prior to Thanksgiving she denies any metrorrhagia. Overdue for PAP. Has a history of abnormal PAP smears. Denies abdominal pain, nausea, vomiting, dysuria, flank pain or hematuria.    Past Medical History:  Diagnosis Date  . Abnormal Pap smear    repeat pap  . Anemia    as child   . Hypertension     Past Surgical History:  Procedure Laterality Date  . BREAST EXCISIONAL BIOPSY Left   . finger amputee Left  2013    1st finger   . left breast cyst  Left 1986    Family History  Problem Relation Age of Onset  . Cancer Father        lung  . Hypertension Father   . Hypertension Mother   . Breast cancer Maternal Aunt   . Breast cancer Cousin     Social History   Socioeconomic History  . Marital status: Single    Spouse name: Not on file  . Number of children: 0  . Years of education: college   . Highest education level: Not on file  Occupational History  . Occupation: Early Childhood education    Employer: [employer]    Comment: part time, loader, manual work   Tobacco Use  . Smoking status: Former Research scientist (life sciences)  . Smokeless tobacco: Never Used  Substance and Sexual Activity  . Alcohol use: Yes    Comment: rarely  . Drug use: Yes    Types: Marijuana  . Sexual activity: Yes    Birth control/protection: None  Other Topics Concern  . Not on file  Social History Narrative   Lives alone   2 god babies    Mom in [place], [place]. , mom 34 in 2016    Social Determinants of Health   Financial Resource Strain:   . Difficulty of Paying Living Expenses: Not on file  Food Insecurity:   . Worried About Charity fundraiser in the Last Year: Not on file  . Ran Out of Food in the Last Year: Not on file  Transportation  Needs:   . Lack of Transportation (Medical): Not on file  . Lack of Transportation (Non-Medical): Not on file  Physical Activity:   . Days of Exercise per Week: Not on file  . Minutes of Exercise per Session: Not on file  Stress:   . Feeling of Stress : Not on file  Social Connections:   . Frequency of Communication with Friends and Family: Not on file  . Frequency of Social Gatherings with Friends and Family: Not on file  . Attends Religious Services: Not on file  . Active Member of Clubs or Organizations: Not on file  . Attends Archivist Meetings: Not on file  . Marital Status: Not on file     Observations/Objective: Awake, alert and oriented x 3   Review of  Systems  Constitutional: Negative for fever, malaise/fatigue and weight loss.  HENT: Negative.  Negative for nosebleeds.   Eyes: Negative.  Negative for blurred vision, double vision and photophobia.  Respiratory: Negative.  Negative for cough and shortness of breath.   Cardiovascular: Negative.  Negative for chest pain, palpitations and leg swelling.  Gastrointestinal: Negative.  Negative for heartburn, nausea and vomiting.  Genitourinary: Negative for dysuria, flank pain, frequency, hematuria and urgency.       SEE HPI  Musculoskeletal: Negative.  Negative for myalgias.  Neurological: Negative.  Negative for dizziness, focal weakness, seizures and headaches.  Psychiatric/Behavioral: Negative.  Negative for suicidal ideas.    Assessment and Plan: Danne was seen today for amenorrhea.  Diagnoses and all orders for this visit:  Abnormal uterine bleeding -     POCT urine pregnancy; Future Likely perimenopausal. Schedule for PAP   Colon cancer screening -     Ambulatory referral to Gastroenterology  Family history of thyroid disease in mother -     TSH; Future  Vitamin D deficiency -     VITAMIN D 25 Hydroxy (Vit-D Deficiency, Fractures); Future  Dyslipidemia -     Lipid panel; Future INSTRUCTIONS: Work on a low fat, heart healthy diet and participate in regular aerobic exercise program by working out at least 150 minutes per week; 5 days a week-30 minutes per day. Avoid red meat/beef/steak,  fried foods. junk foods, sodas, sugary drinks, unhealthy snacking, alcohol and smoking.  Drink at least 80 oz of water per day and monitor your carbohydrate intake daily.    Essential hypertension -     CMP14+EGFR; Future Continue all antihypertensives as prescribed.  Remember to bring in your blood pressure log with you for your follow up appointment.  DASH/Mediterranean Diets are healthier choices for HTN.   Mild anemia -     CBC; Future     Follow Up Instructions Return for PAP  SMEAR.     I discussed the assessment and treatment plan with the patient. The patient was provided an opportunity to ask questions and all were answered. The patient agreed with the plan and demonstrated an understanding of the instructions.   The patient was advised to call back or seek an in-person evaluation if the symptoms worsen or if the condition fails to improve as anticipated.  I provided 15 minutes of non-face-to-face time during this encounter including median intraservice time, reviewing previous notes, labs, imaging, medications and explaining diagnosis and management.  Gildardo Pounds, FNP-BC

## 2019-07-28 ENCOUNTER — Encounter: Payer: Self-pay | Admitting: Nurse Practitioner

## 2019-08-01 ENCOUNTER — Other Ambulatory Visit: Payer: Self-pay | Admitting: Family Medicine

## 2019-08-01 DIAGNOSIS — I10 Essential (primary) hypertension: Secondary | ICD-10-CM

## 2019-08-02 ENCOUNTER — Ambulatory Visit: Payer: Managed Care, Other (non HMO) | Attending: Nurse Practitioner

## 2019-08-02 ENCOUNTER — Other Ambulatory Visit: Payer: Self-pay

## 2019-08-02 DIAGNOSIS — I10 Essential (primary) hypertension: Secondary | ICD-10-CM

## 2019-08-02 DIAGNOSIS — D649 Anemia, unspecified: Secondary | ICD-10-CM

## 2019-08-02 DIAGNOSIS — Z8349 Family history of other endocrine, nutritional and metabolic diseases: Secondary | ICD-10-CM

## 2019-08-02 DIAGNOSIS — E559 Vitamin D deficiency, unspecified: Secondary | ICD-10-CM

## 2019-08-02 DIAGNOSIS — E785 Hyperlipidemia, unspecified: Secondary | ICD-10-CM

## 2019-08-02 MED FILL — AMLODIPINE BESYLATE 10 MG T: 10 | 30 days supply | Qty: 30 | Fill #0

## 2019-08-03 LAB — LIPID PANEL
Chol/HDL Ratio: 3 ratio (ref 0.0–4.4)
Cholesterol, Total: 222 mg/dL — ABNORMAL HIGH (ref 100–199)
HDL: 75 mg/dL (ref >39)
LDL Chol Calc (NIH): 120 mg/dL — ABNORMAL HIGH (ref 0–99)
Triglycerides: 158 mg/dL — ABNORMAL HIGH (ref 0–149)
VLDL Cholesterol Cal: 27 mg/dL (ref 5–40)

## 2019-08-03 LAB — CBC
Hematocrit: 33.2 % — ABNORMAL LOW (ref 34.0–46.6)
Hemoglobin: 11.3 g/dL (ref 11.1–15.9)
MCH: 29.6 pg (ref 26.6–33.0)
MCHC: 34 g/dL (ref 31.5–35.7)
MCV: 87 fL (ref 79–97)
Platelets: 272 10*3/uL (ref 150–450)
RBC: 3.82 x10E6/uL (ref 3.77–5.28)
RDW: 15.1 % (ref 11.7–15.4)
WBC: 7 10*3/uL (ref 3.4–10.8)

## 2019-08-03 LAB — CMP14+EGFR
ALT: 23 IU/L (ref 0–32)
AST: 28 IU/L (ref 0–40)
Albumin/Globulin Ratio: 1.5 (ref 1.2–2.2)
Albumin: 4.1 g/dL (ref 3.8–4.9)
Alkaline Phosphatase: 78 IU/L (ref 39–117)
BUN/Creatinine Ratio: 14 (ref 9–23)
BUN: 13 mg/dL (ref 6–24)
Bilirubin Total: 0.2 mg/dL (ref 0.0–1.2)
CO2: 24 mmol/L (ref 20–29)
Calcium: 9.5 mg/dL (ref 8.7–10.2)
Chloride: 106 mmol/L (ref 96–106)
Creatinine, Ser: 0.9 mg/dL (ref 0.57–1.00)
GFR calc Af Amer: 84 mL/min/{1.73_m2} (ref >59)
GFR calc non Af Amer: 73 mL/min/{1.73_m2} (ref >59)
Globulin, Total: 2.8 g/dL (ref 1.5–4.5)
Glucose: 97 mg/dL (ref 65–99)
Potassium: 5.2 mmol/L (ref 3.5–5.2)
Sodium: 143 mmol/L (ref 134–144)
Total Protein: 6.9 g/dL (ref 6.0–8.5)

## 2019-08-03 LAB — TSH: TSH: 0.707 u[IU]/mL (ref 0.450–4.500)

## 2019-08-03 LAB — VITAMIN D 25 HYDROXY (VIT D DEFICIENCY, FRACTURES): Vit D, 25-Hydroxy: 12.5 ng/mL — ABNORMAL LOW (ref 30.0–100.0)

## 2019-08-06 ENCOUNTER — Other Ambulatory Visit: Payer: Self-pay | Admitting: Nurse Practitioner

## 2019-08-06 DIAGNOSIS — E559 Vitamin D deficiency, unspecified: Secondary | ICD-10-CM

## 2019-08-06 MED ORDER — ATORVASTATIN CALCIUM 20 MG PO TABS: 20.0000 mg | ORAL_TABLET | Freq: Every day | ORAL | 0 refills | Status: DC

## 2019-08-06 MED ORDER — VITAMIN D (ERGOCALCIFEROL) 1.25 MG (50000 UNIT) PO CAPS: 50000.0000 [IU] | ORAL_CAPSULE | ORAL | 0 refills | Status: AC

## 2019-08-07 MED FILL — ATORVASTATIN CALCIUM 20 MG: 20 | 30 days supply | Qty: 30 | Fill #0

## 2019-08-07 MED FILL — VIT D2 1.25 MG (50,000 UNIT: 1.25 MG | 28 days supply | Qty: 4 | Fill #0

## 2019-08-30 ENCOUNTER — Ambulatory Visit (AMBULATORY_SURGERY_CENTER): Payer: Self-pay | Admitting: *Deleted

## 2019-08-30 ENCOUNTER — Other Ambulatory Visit: Payer: Self-pay

## 2019-08-30 VITALS — Temp 98.0°F | Ht 65.0 in | Wt 197.8 lb

## 2019-08-30 DIAGNOSIS — Z01818 Encounter for other preprocedural examination: Secondary | ICD-10-CM

## 2019-08-30 DIAGNOSIS — Z1211 Encounter for screening for malignant neoplasm of colon: Secondary | ICD-10-CM

## 2019-08-30 MED ORDER — SUPREP BOWEL PREP KIT 17.5-3.13-1.6 GM/177ML PO SOLN: ORAL | 0 refills | Status: DC

## 2019-08-30 MED FILL — SUPREP BOWEL PREP KIT: 17.5-3.13-1 | 1 days supply | Qty: 354 | Fill #0

## 2019-08-30 NOTE — Progress Notes (Signed)
covid test 09-08-19 at 3:25 pm  Pt is aware that care partner will wait in the car during procedure; if they feel like they will be too hot or cold to wait in the car; they may wait in the 4 th floor lobby. Patient is aware to bring only one care partner. We want them to wear a mask (we do not have any that we can provide them), practice social distancing, and we will check their temperatures when they get here.  I did remind the patient that their care partner needs to stay in the parking lot the entire time and have a cell phone available, we will call them when the pt is ready for discharge. Patient will wear mask into building.   No trouble with anesthesia, difficulty with moving neck or hx/fam hx of malignant hyperthermia per pt  No egg or soy allergy  No home oxygen use   No medications for weight loss taken  emmi information given

## 2019-09-05 MED FILL — ATORVASTATIN CALCIUM 20 MG: 20 | 30 days supply | Qty: 30 | Fill #1

## 2019-09-05 MED FILL — AMLODIPINE BESYLATE 10 MG T: 10 | 30 days supply | Qty: 30 | Fill #1

## 2019-09-08 ENCOUNTER — Other Ambulatory Visit: Payer: Self-pay | Admitting: Gastroenterology

## 2019-09-08 ENCOUNTER — Encounter: Payer: Self-pay | Admitting: Gastroenterology

## 2019-09-08 ENCOUNTER — Other Ambulatory Visit: Payer: Self-pay

## 2019-09-08 ENCOUNTER — Ambulatory Visit (INDEPENDENT_AMBULATORY_CARE_PROVIDER_SITE_OTHER): Payer: Managed Care, Other (non HMO)

## 2019-09-08 DIAGNOSIS — Z1159 Encounter for screening for other viral diseases: Secondary | ICD-10-CM

## 2019-09-09 LAB — SARS CORONAVIRUS 2 (TAT 6-24 HRS): SARS Coronavirus 2: NEGATIVE

## 2019-09-13 ENCOUNTER — Encounter: Payer: Self-pay | Admitting: Gastroenterology

## 2019-09-13 ENCOUNTER — Ambulatory Visit (AMBULATORY_SURGERY_CENTER): Payer: Managed Care, Other (non HMO) | Admitting: Gastroenterology

## 2019-09-13 ENCOUNTER — Other Ambulatory Visit: Payer: Self-pay

## 2019-09-13 VITALS — BP 118/74 | HR 72 | Temp 95.7°F | Resp 12 | Ht 65.0 in | Wt 197.0 lb

## 2019-09-13 DIAGNOSIS — D125 Benign neoplasm of sigmoid colon: Secondary | ICD-10-CM | POA: Diagnosis not present

## 2019-09-13 DIAGNOSIS — D123 Benign neoplasm of transverse colon: Secondary | ICD-10-CM

## 2019-09-13 DIAGNOSIS — Z1211 Encounter for screening for malignant neoplasm of colon: Secondary | ICD-10-CM

## 2019-09-13 MED ORDER — SODIUM CHLORIDE 0.9 % IV SOLN: 500.0000 mL | Freq: Once | INTRAVENOUS | Status: AC

## 2019-09-13 NOTE — Op Note (Signed)
[facility] Patient Name: Autumn Gomez Procedure Date: 09/13/2019 8:52 AM MRN: [id number] Endoscopist: Thornton Park MD, MD Age: 54 Referring MD:  Date of Birth: May 06, 1966 Gender: Female Account #: [id number] Procedure:                Colonoscopy Indications:              Screening for colorectal malignant neoplasm, This                            is the patient's first colonoscopy                           No known family history of colon cancer or polyps Medicines:                Monitored Anesthesia Care Procedure:                Pre-Anesthesia Assessment:                           - Prior to the procedure, a History and Physical                            was performed, and patient medications and                            allergies were reviewed. The patient's tolerance of                            previous anesthesia was also reviewed. The risks                            and benefits of the procedure and the sedation                            options and risks were discussed with the patient.                            All questions were answered, and informed consent                            was obtained. Prior Anticoagulants: The patient has                            taken no previous anticoagulant or antiplatelet                            agents. ASA Grade Assessment: II - A patient with                            mild systemic disease. After reviewing the risks                            and benefits, the patient was deemed in  satisfactory condition to undergo the procedure.                           After obtaining informed consent, the colonoscope                            was passed under direct vision. Throughout the                            procedure, the patient's blood pressure, pulse, and                            oxygen saturations were monitored continuously. The                            Colonoscope was  introduced through the anus and                            advanced to the the cecum, identified by                            appendiceal orifice and ileocecal valve. A second                            forward view of the right colon was performed. The                            colonoscopy was performed without difficulty. The                            patient tolerated the procedure well. The quality                            of the bowel preparation was good. The ileocecal                            valve, appendiceal orifice, and rectum were                            photographed. Scope In: 9:00:49 AM Scope Out: 9:15:10 AM Scope Withdrawal Time: 0 hours 8 minutes 53 seconds  Total Procedure Duration: 0 hours 14 minutes 21 seconds  Findings:                 The perianal and digital rectal examinations were                            normal.                           Non-bleeding internal hemorrhoids were found. The                            hemorrhoids were small.  A 3 mm polyp was found in the sigmoid colon. The                            polyp was sessile. The polyp was removed with a                            cold snare. Resection and retrieval were complete.                            Estimated blood loss was minimal.                           A less than 1 mm polyp was found in the hepatic                            flexure. The polyp was flat. The polyp was removed                            with a cold biopsy forceps. Resection and retrieval                            were complete. Estimated blood loss was minimal.                           The exam was otherwise without abnormality on                            direct and retroflexion views. Complications:            No immediate complications. Estimated blood loss:                            Minimal. Estimated Blood Loss:     Estimated blood loss was minimal. Impression:               -  Non-bleeding internal hemorrhoids.                           - One 3 mm polyp in the sigmoid colon, removed with                            a cold snare. Resected and retrieved.                           - One less than 1 mm polyp at the hepatic flexure,                            removed with a cold biopsy forceps. Resected and                            retrieved.                           - The examination was otherwise normal on  direct                            and retroflexion views. Recommendation:           - Patient has a contact number available for                            emergencies. The signs and symptoms of potential                            delayed complications were discussed with the                            patient. Return to normal activities tomorrow.                            Written discharge instructions were provided to the                            patient.                           - Resume previous diet.                           - Continue present medications.                           - Await pathology results.                           - Repeat colonoscopy date to be determined after                            pending pathology results are reviewed for                            surveillance.                           - Emerging evidence supports eating a diet of                            fruits, vegetables, grains, calcium, and yogurt                            while reducing red meat and alcohol may reduce the                            risk of colon cancer.                           - Thank you for allowing me to be involved in your                            colon cancer prevention. Thornton Park  MD, MD 09/13/2019 9:20:25 AM This report has been signed electronically.

## 2019-09-13 NOTE — Progress Notes (Signed)
Temp-YF VS-DT  Pt's states no medical or surgical changes since previsit or office visit.

## 2019-09-13 NOTE — Patient Instructions (Signed)
Discharge instructions given. Handouts on polyps and hemorrhoids. Resume previous medications. YOU HAD AN ENDOSCOPIC PROCEDURE TODAY AT THE [facility]:   Refer to the procedure report that was given to you for any specific questions about what was found during the examination.  If the procedure report does not answer your questions, please call your gastroenterologist to clarify.  If you requested that your care partner not be given the details of your procedure findings, then the procedure report has been included in a sealed envelope for you to review at your convenience later.  YOU SHOULD EXPECT: Some feelings of bloating in the abdomen. Passage of more gas than usual.  Walking can help get rid of the air that was put into your GI tract during the procedure and reduce the bloating. If you had a lower endoscopy (such as a colonoscopy or flexible sigmoidoscopy) you may notice spotting of blood in your stool or on the toilet paper. If you underwent a bowel prep for your procedure, you may not have a normal bowel movement for a few days.  Please Note:  You might notice some irritation and congestion in your nose or some drainage.  This is from the oxygen used during your procedure.  There is no need for concern and it should clear up in a day or so.  SYMPTOMS TO REPORT IMMEDIATELY:  Following lower endoscopy (colonoscopy or flexible sigmoidoscopy):  Excessive amounts of blood in the stool  Significant tenderness or worsening of abdominal pains  Swelling of the abdomen that is new, acute  Fever of 100F or higher   For urgent or emergent issues, a gastroenterologist can be reached at any hour by calling [phone]. Do not use MyChart messaging for urgent concerns.    DIET:  We do recommend a small meal at first, but then you may proceed to your regular diet.  Drink plenty of fluids but you should avoid alcoholic beverages for 24 hours.  ACTIVITY:  You should plan to take it  easy for the rest of today and you should NOT DRIVE or use heavy machinery until tomorrow (because of the sedation medicines used during the test).    FOLLOW UP: Our staff will call the number listed on your records 48-72 hours following your procedure to check on you and address any questions or concerns that you may have regarding the information given to you following your procedure. If we do not reach you, we will leave a message.  We will attempt to reach you two times.  During this call, we will ask if you have developed any symptoms of COVID 19. If you develop any symptoms (ie: fever, flu-like symptoms, shortness of breath, cough etc.) before then, please call [phone].  If you test positive for Covid 19 in the 2 weeks post procedure, please call and report this information to us.    If any biopsies were taken you will be contacted by phone or by letter within the next 1-3 weeks.  Please call us at [phone] if you have not heard about the biopsies in 3 weeks.    SIGNATURES/CONFIDENTIALITY: You and/or your care partner have signed paperwork which will be entered into your electronic medical record.  These signatures attest to the fact that that the information above on your After Visit Summary has been reviewed and is understood.  Full responsibility of the confidentiality of this discharge information lies with you and/or your care-partner.  

## 2019-09-13 NOTE — Progress Notes (Signed)
Called to room to assist during endoscopic procedure.  Patient ID and intended procedure confirmed with present staff. Received instructions for my participation in the procedure from the performing physician.  

## 2019-09-13 NOTE — Progress Notes (Signed)
A and O x3. Report to RN. Tolerated MAC anesthesia well.

## 2019-09-15 ENCOUNTER — Telehealth: Payer: Self-pay

## 2019-09-15 NOTE — Telephone Encounter (Signed)
Pt called stating she is doing fine.

## 2019-09-15 NOTE — Telephone Encounter (Signed)
No answer, left message to call back later today, B.Bexley Mclester RN. 

## 2019-09-15 NOTE — Telephone Encounter (Signed)
  Follow up Call-  Call back number 09/13/2019  Post procedure Call Back phone  # [phone]  Permission to leave phone message Yes  Some recent data might be hidden     Patient questions:  Do you have a fever, pain , or abdominal swelling? No. Pain Score  0 *  Have you tolerated food without any problems? Yes.    Have you been able to return to your normal activities? Yes.    Do you have any questions about your discharge instructions: Diet   No. Medications  No. Follow up visit  No.  Do you have questions or concerns about your Care? No.  Actions: * If pain score is 4 or above: 1. No action needed, pain <4.Have you developed a fever since your procedure? no  2.   Have you had an respiratory symptoms (SOB or cough) since your procedure? no  3.   Have you tested positive for COVID 19 since your procedure no  4.   Have you had any family members/close contacts diagnosed with the COVID 19 since your procedure?  no   If yes to any of these questions please route to Joylene John, RN and Erenest Rasher, RN

## 2019-09-20 ENCOUNTER — Encounter: Payer: Self-pay | Admitting: Gastroenterology

## 2019-10-04 MED FILL — FLUTICASONE PROP 50 MCG SPR: 50 | 30 days supply | Qty: 16 | Fill #1

## 2019-10-04 MED FILL — ATORVASTATIN CALCIUM 20 MG: 20 | 30 days supply | Qty: 30 | Fill #2

## 2019-10-04 MED FILL — AMLODIPINE BESYLATE 10 MG T: 10 | 30 days supply | Qty: 30 | Fill #2

## 2019-11-01 ENCOUNTER — Other Ambulatory Visit: Payer: Self-pay | Admitting: Nurse Practitioner

## 2019-11-01 ENCOUNTER — Other Ambulatory Visit: Payer: Self-pay | Admitting: Family Medicine

## 2019-11-01 DIAGNOSIS — I10 Essential (primary) hypertension: Secondary | ICD-10-CM

## 2020-01-17 ENCOUNTER — Other Ambulatory Visit (HOSPITAL_COMMUNITY)
Admission: RE | Admit: 2020-01-17 | Discharge: 2020-01-17 | Disposition: A | Discharge status: Home / Self Care | Payer: 59 | Source: Ambulatory Visit | Attending: Nurse Practitioner | Admitting: Nurse Practitioner

## 2020-01-17 ENCOUNTER — Ambulatory Visit (HOSPITAL_BASED_OUTPATIENT_CLINIC_OR_DEPARTMENT_OTHER): Payer: 59 | Admitting: Nurse Practitioner

## 2020-01-17 ENCOUNTER — Other Ambulatory Visit: Payer: Self-pay

## 2020-01-17 VITALS — BP 117/80 | HR 86 | Temp 97.7°F | Ht 65.0 in | Wt 203.0 lb

## 2020-01-17 DIAGNOSIS — I10 Essential (primary) hypertension: Secondary | ICD-10-CM | POA: Diagnosis not present

## 2020-01-17 DIAGNOSIS — Z1159 Encounter for screening for other viral diseases: Secondary | ICD-10-CM

## 2020-01-17 DIAGNOSIS — E785 Hyperlipidemia, unspecified: Secondary | ICD-10-CM | POA: Diagnosis not present

## 2020-01-17 DIAGNOSIS — Z1231 Encounter for screening mammogram for malignant neoplasm of breast: Secondary | ICD-10-CM

## 2020-01-17 DIAGNOSIS — S68119S Complete traumatic metacarpophalangeal amputation of unspecified finger, sequela: Secondary | ICD-10-CM

## 2020-01-17 DIAGNOSIS — Z124 Encounter for screening for malignant neoplasm of cervix: Secondary | ICD-10-CM | POA: Diagnosis present

## 2020-01-17 DIAGNOSIS — R768 Other specified abnormal immunological findings in serum: Secondary | ICD-10-CM

## 2020-01-17 MED ORDER — AMLODIPINE BESYLATE 10 MG PO TABS: 10.0000 mg | ORAL_TABLET | Freq: Every day | ORAL | 0 refills | Status: DC

## 2020-01-17 MED ORDER — ATORVASTATIN CALCIUM 20 MG PO TABS: 20.0000 mg | ORAL_TABLET | Freq: Every day | ORAL | 0 refills | Status: DC

## 2020-01-17 NOTE — Patient Instructions (Signed)
Uterine Fibroids  Uterine fibroids are lumps of tissue (tumors) in your womb (uterus). They are not cancer (are benign). Most women with this condition do not need treatment. Sometimes fibroids can affect your ability to have children (your fertility). If that happens, you may need surgery to take out the fibroids. Follow these instructions at home:  Take over-the-counter and prescription medicines only as told by your doctor. Your doctor may suggest NSAIDs (such as aspirin or ibuprofen) to help with pain.  Ask your doctor if you should: ? Take iron pills. ? Eat more foods that have iron in them, such as dark green, leafy vegetables.  If directed, apply heat to your back or belly to reduce pain. Use the heat source that your doctor recommends, such as a moist heat pack or a heating pad. ? Put a towel between your skin and the heat source. ? Leave the heat on for 20-30 minutes. ? Remove the heat if your skin turns bright red. This is especially important if you are unable to feel pain, heat, or cold. You may have a greater risk of getting burned.  Pay close attention to your period (menstrual) cycles. Tell your doctor about any changes, such as: ? A heavier blood flow than usual. ? Needing to use more pads or tampons than normal. ? A change in how many days your period lasts. ? A change in symptoms that come with your period, such as cramps or back pain.  Keep all follow-up visits as told by your doctor. This is important. Your doctor may need to watch your fibroids over time for any changes. Contact a doctor if you:  Have pain that does not get better with medicine or heat, such as pain or cramps in: ? Your back. ? The area between your hip bones (pelvic area). ? Your belly.  Have new bleeding between your periods.  Have more bleeding during or between your periods.  Feel very tired or weak.  Feel light-headed. Get help right away if you:  Pass out (faint).  Have pain in the  area between your hip bones that suddenly gets worse.  Have bleeding that soaks a tampon or pad in 30 minutes or less. Summary  Uterine fibroids are lumps of tissue (tumors) in your womb (uterus). They are not cancer.  The only treatment that most women need is taking aspirin or ibuprofen for pain.  Contact a doctor if you have pain or cramps that do not get better with medicine.  Make sure you know what symptoms you should get help for right away. This information is not intended to replace advice given to you by your health care provider. Make sure you discuss any questions you have with your health care provider. Document Revised: 05/21/2017 Document Reviewed: 05/04/2017 Elsevier Patient Education  2020 Elsevier Inc.  

## 2020-01-17 NOTE — Progress Notes (Signed)
Assessment & Plan:  Diagnoses and all orders for this visit:  Encounter for Papanicolaou smear for cervical cancer screening -     Cervicovaginal ancillary only -     Cytology - PAP  Dyslipidemia -     atorvastatin (LIPITOR) 20 MG tablet; Take 1 tablet (20 mg total) by mouth daily. INSTRUCTIONS: Work on a low fat, heart healthy diet and participate in regular aerobic exercise program by working out at least 150 minutes per week; 5 days a week-30 minutes per day. Avoid red meat/beef/steak,  fried foods. junk foods, sodas, sugary drinks, unhealthy snacking, alcohol and smoking.  Drink at least 80 oz of water per day and monitor your carbohydrate intake daily.    Essential hypertension -     amLODipine (NORVASC) 10 MG tablet; Take 1 tablet (10 mg total) by mouth daily. -     CMP14+EGFR Continue all antihypertensives as prescribed.  Remember to bring in your blood pressure log with you for your follow up appointment.  DASH/Mediterranean Diets are healthier choices for HTN.  BP Readings from Last 3 Encounters:  01/17/20 117/80  09/13/19 118/74  01/04/19 118/74    Encounter for hepatitis C screening test for low risk patient -     Hepatitis C Antibody  Breast cancer screening by mammogram -     MM 3D SCREEN BREAST BILATERAL; Future      Patient has been counseled on age-appropriate routine health concerns for screening and prevention. These are reviewed and up-to-date. Referrals have been placed accordingly. Immunizations are up-to-date or declined.    Subjective:   Chief Complaint  Patient presents with  . Gynecologic Exam    Pt. is here for pap smear.    HPI Autumn Gomez 54 y.o. female presents to office today for PAP smear.     Review of Systems  Constitutional: Negative.  Negative for chills, fever, malaise/fatigue and weight loss.  Respiratory: Negative.  Negative for cough, shortness of breath and wheezing.   Cardiovascular: Negative.  Negative for chest  pain, orthopnea and leg swelling.  Gastrointestinal: Negative for abdominal pain.  Genitourinary: Negative.  Negative for flank pain.  Skin: Negative.  Negative for rash.  Psychiatric/Behavioral: Negative for suicidal ideas.    Past Medical History:  Diagnosis Date  . Abnormal Pap smear    repeat pap  . Anemia    as child   . Hyperlipidemia   . Hypertension     Past Surgical History:  Procedure Laterality Date  . BREAST EXCISIONAL BIOPSY Left   . 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 left breast cyst  Left 1986    Family History  Problem Relation Age of Onset  . Cancer Father        lung  . Hypertension Father   . Hypertension Mother   . Breast cancer Maternal Aunt   . Breast cancer Cousin   . Colon cancer Neg Hx   . Esophageal cancer Neg Hx   . Stomach cancer Neg Hx   . Rectal cancer Neg Hx     Social History Reviewed with no changes to be made today.   Outpatient Medications Prior to Visit  Medication Sig Dispense Refill  . cyclobenzaprine (FLEXERIL) 10 MG tablet Take 1 tablet (10 mg total) by mouth at bedtime. 30 tablet 0  . fluticasone (FLONASE) 50 MCG/ACT nasal spray PLACE 2 SPRAYS INTO BOTH NOSTRILS DAILY. 16 g 2  . amLODipine (NORVASC) 10 MG tablet Take 1  tablet (10 mg total) by mouth daily. Must have office visit for refills 30 tablet 2  . atorvastatin (LIPITOR) 20 MG tablet Take 1 tablet (20 mg total) by mouth daily. 30 tablet 2   Facility-Administered Medications Prior to Visit  Medication Dose Route Frequency Provider Last Rate Last Admin  . 0.9 %  sodium chloride infusion  500 mL Intravenous Once Thornton Park, MD        Allergies  Allergen Reactions  . Keflex [Cephalexin]     Severe rash       Objective:    BP 117/80 (BP Location: Left Arm, Patient Position: Sitting, Cuff Size: Normal)   Pulse 86   Temp 97.7 F (36.5 C) (Temporal)   Ht 5' 5" (1.651 m)   Wt (!) 203 lb (92.1 kg)   SpO2 96%   BMI 33.78 kg/m  Wt Readings from  Last 3 Encounters:  01/17/20 (!) 203 lb (92.1 kg)  09/13/19 197 lb (89.4 kg)  08/30/19 197 lb 12.8 oz (89.7 kg)    Physical Exam Exam conducted with a chaperone present.  Constitutional:      Appearance: She is well-developed.  HENT:     Head: Normocephalic.  Cardiovascular:     Rate and Rhythm: Normal rate and regular rhythm.     Heart sounds: Normal heart sounds.  Pulmonary:     Effort: Pulmonary effort is normal.     Breath sounds: Normal breath sounds.  Abdominal:     General: Bowel sounds are normal.     Palpations: Abdomen is soft.     Hernia: There is no hernia in the left inguinal area.  Genitourinary:    Exam position: Lithotomy position.     Labia:        Right: No rash, tenderness, lesion or injury.        Left: No rash, tenderness, lesion or injury.      Vagina: Normal. No signs of injury and foreign body. No vaginal discharge, erythema, tenderness or bleeding.     Cervix: No cervical motion tenderness or friability.     Uterus: Not deviated and not enlarged.      Adnexa:        Right: No mass, tenderness or fullness.         Left: No mass, tenderness or fullness.       Rectum: Normal. No external hemorrhoid.  Lymphadenopathy:     Lower Body: No right inguinal adenopathy. No left inguinal adenopathy.  Skin:    General: Skin is warm and dry.  Neurological:     Mental Status: She is alert and oriented to person, place, and time.  Psychiatric:        Behavior: Behavior normal.        Thought Content: Thought content normal.        Judgment: Judgment normal.          Patient has been counseled extensively about nutrition and exercise as well as the importance of adherence with medications and regular follow-up. The patient was given clear instructions to go to ER or return to medical center if symptoms don't improve, worsen or new problems develop. The patient verbalized understanding.   Follow-up: Return in about 3 months (around 04/18/2020).   Gildardo Pounds, FNP-BC [facility] [place], [place]   01/18/2020, 9:57 PM

## 2020-01-18 ENCOUNTER — Encounter: Payer: Self-pay | Admitting: Nurse Practitioner

## 2020-01-18 LAB — CERVICOVAGINAL ANCILLARY ONLY
Bacterial Vaginitis (gardnerella): POSITIVE — AB
Candida Glabrata: NEGATIVE
Candida Vaginitis: NEGATIVE
Chlamydia: NEGATIVE
Comment: NEGATIVE
Comment: NEGATIVE
Comment: NEGATIVE
Comment: NEGATIVE
Comment: NEGATIVE
Comment: NORMAL
Neisseria Gonorrhea: NEGATIVE
Trichomonas: NEGATIVE

## 2020-01-18 LAB — CMP14+EGFR
ALT: 23 IU/L (ref 0–32)
AST: 32 IU/L (ref 0–40)
Albumin/Globulin Ratio: 1.8 (ref 1.2–2.2)
Albumin: 4.4 g/dL (ref 3.8–4.9)
Alkaline Phosphatase: 73 IU/L (ref 48–121)
BUN/Creatinine Ratio: 14 (ref 9–23)
BUN: 13 mg/dL (ref 6–24)
Bilirubin Total: 0.3 mg/dL (ref 0.0–1.2)
CO2: 22 mmol/L (ref 20–29)
Calcium: 9.1 mg/dL (ref 8.7–10.2)
Chloride: 104 mmol/L (ref 96–106)
Creatinine, Ser: 0.91 mg/dL (ref 0.57–1.00)
GFR calc Af Amer: 83 mL/min/{1.73_m2} (ref >59)
GFR calc non Af Amer: 72 mL/min/{1.73_m2} (ref >59)
Globulin, Total: 2.4 g/dL (ref 1.5–4.5)
Glucose: 92 mg/dL (ref 65–99)
Potassium: 4.1 mmol/L (ref 3.5–5.2)
Sodium: 139 mmol/L (ref 134–144)
Total Protein: 6.8 g/dL (ref 6.0–8.5)

## 2020-01-18 LAB — HEPATITIS C ANTIBODY: Hep C Virus Ab: >11 s/co ratio — ABNORMAL HIGH (ref 0.0–0.9)

## 2020-01-18 MED ORDER — METRONIDAZOLE 500 MG PO TABS: 500.0000 mg | ORAL_TABLET | Freq: Two times a day (BID) | ORAL | 0 refills | Status: AC

## 2020-01-19 MED FILL — metroNIDAZOLE 500 MG TABS: 500 | 7 days supply | Qty: 14 | Fill #0

## 2020-01-22 LAB — CYTOLOGY - PAP
Comment: NEGATIVE
Diagnosis: UNDETERMINED — AB
High risk HPV: NEGATIVE

## 2020-01-31 ENCOUNTER — Other Ambulatory Visit: Payer: Self-pay | Admitting: Nurse Practitioner

## 2020-01-31 ENCOUNTER — Ambulatory Visit: Payer: Managed Care, Other (non HMO) | Attending: Nurse Practitioner

## 2020-01-31 MED FILL — AMLODIPINE BESYLATE 10 MG T: 10 | 90 days supply | Qty: 90 | Fill #0

## 2020-01-31 MED FILL — ATORVASTATIN CALCIUM 20 MG: 20 | 90 days supply | Qty: 90 | Fill #0

## 2020-01-31 MED FILL — FLUTICASONE PROP 50 MCG SPR: 50 | 30 days supply | Qty: 16 | Fill #2

## 2020-01-31 MED FILL — metroNIDAZOLE 500 MG TABS: 500 | 7 days supply | Qty: 14 | Fill #0

## 2020-02-01 ENCOUNTER — Other Ambulatory Visit: Payer: Self-pay | Admitting: Nurse Practitioner

## 2020-02-01 DIAGNOSIS — R768 Other specified abnormal immunological findings in serum: Secondary | ICD-10-CM

## 2020-02-01 LAB — HCV RNA QUANT
HCV log10: 5.47 log10 IU/mL
Hepatitis C Quantitation: 295000 IU/mL

## 2020-02-05 ENCOUNTER — Other Ambulatory Visit: Payer: Self-pay

## 2020-02-05 ENCOUNTER — Ambulatory Visit
Admission: RE | Admit: 2020-02-05 | Discharge: 2020-02-05 | Disposition: A | Discharge status: Home / Self Care | Payer: 59 | Source: Ambulatory Visit | Attending: Nurse Practitioner | Admitting: Nurse Practitioner

## 2020-02-05 DIAGNOSIS — Z1231 Encounter for screening mammogram for malignant neoplasm of breast: Secondary | ICD-10-CM

## 2020-02-23 ENCOUNTER — Other Ambulatory Visit: Payer: Self-pay

## 2020-02-23 ENCOUNTER — Other Ambulatory Visit: Payer: Managed Care, Other (non HMO)

## 2020-02-23 DIAGNOSIS — Z20822 Contact with and (suspected) exposure to covid-19: Secondary | ICD-10-CM

## 2020-02-24 LAB — NOVEL CORONAVIRUS, NAA: SARS-CoV-2, NAA: NOT DETECTED

## 2020-02-28 ENCOUNTER — Telehealth: Payer: Self-pay | Admitting: Pharmacy Technician

## 2020-02-28 NOTE — Telephone Encounter (Signed)
[facility] Patient Teacher, English as a foreign language completed.    The patient is insured through [employer].  Medication must be filled through their pharmacy.  [phone]  We will continue to follow to see if copay assistance is needed.  Autumn Gomez. [name] Patient [facility] Phone: [phone] Fax:  [phone]

## 2020-02-29 ENCOUNTER — Encounter: Payer: Self-pay | Admitting: Infectious Diseases

## 2020-02-29 ENCOUNTER — Other Ambulatory Visit: Payer: Self-pay

## 2020-02-29 ENCOUNTER — Ambulatory Visit: Payer: 59 | Admitting: Infectious Diseases

## 2020-02-29 VITALS — BP 139/84 | HR 80 | Temp 98.2°F | Ht 65.0 in | Wt 207.0 lb

## 2020-02-29 DIAGNOSIS — B182 Chronic viral hepatitis C: Secondary | ICD-10-CM | POA: Diagnosis not present

## 2020-02-29 DIAGNOSIS — Z23 Encounter for immunization: Secondary | ICD-10-CM | POA: Diagnosis not present

## 2020-02-29 NOTE — Progress Notes (Addendum)
[facility]                                                             [address], [place], [place], [address]                                                                  Phn. [phone]; Fax: [phone]                                                                          Date: 02/29/20    HPI: Autumn Gomez is a 54 y.o.old female with PMH of HTN who is referred for evaluation and management of hep C. She was recently diagnosed with hep C in July as a part of s routine screening with her PCP. She is not sure how she got it. She denies every using IVDU.  No previous history of blood transfusions prior to 1992, sharing of toothbrushes/razors, or sexual contact with known positive partners.  No personal or family history of liver disease.  Denies having tattoos in her body. Has not received treatment to date. She smokes cigars regularly and drinks alcohol socially and uses marijuana on a regular basis. She was sexually active with a female partner in the past. She also had a female partner before but has been single for 4-5 years now. She works in a day care. She says she has already received 2 doses of COVID Vaccine. She was recently tested for COVID vaccine last Friday and it was negative   01/17/20  HCV ab >11 and HCV RNA 2,95,000 AST 32   ALT 23   TB 0.3 12/12/2014 HIV NR   ROS: Denies yellowish discoloration of sclera and skin, abdominal pain/distension, hematemesis.            Dcough, fever, chills, nightsweats, nausea, vomiting, diarrhea, constipation, weight loss, recent hospitalizations, rashes, joint complaints, shortness of breath, chest pain, headaches, dysuria .  Current Outpatient Medications on File Prior to Visit  Medication Sig Dispense Refill  . amLODipine (NORVASC) 10 MG tablet Take 1 tablet (10 mg total) by mouth daily. 90 tablet 0  . atorvastatin (LIPITOR) 20 MG tablet  Take 1 tablet (20 mg total) by mouth daily. 90 tablet 0  . cyclobenzaprine (FLEXERIL) 10 MG tablet Take 1 tablet (10 mg total) by mouth at bedtime. 30 tablet 0  . fluticasone (FLONASE) 50 MCG/ACT nasal spray PLACE 2 SPRAYS INTO BOTH NOSTRILS DAILY. 16 g 2   Current Facility-Administered Medications on File Prior to Visit  Medication Dose Route Frequency Provider Last Rate Last Admin  . 0.9 %  sodium chloride infusion  500 mL Intravenous Once Thornton Park, MD         Allergies  Allergen Reactions  . Keflex [  Cephalexin]     Severe rash   Past Medical History:  Diagnosis Date  . Abnormal Pap smear    repeat pap  . Anemia    as child   . Hyperlipidemia   . Hypertension    Past Surgical History:  Procedure Laterality Date  . BREAST EXCISIONAL BIOPSY Left   . 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 left breast cyst  Left 1986   Social History   Socioeconomic History  . Marital status: Single    Spouse name: Not on file  . Number of children: 0  . Years of education: college   . Highest education level: Not on file  Occupational History  . Occupation: Early Childhood education    Employer: [employer]    Comment: part time, loader, manual work   Tobacco Use  . Smoking status: Former Research scientist (life sciences)  . Smokeless tobacco: Never Used  . Tobacco comment: smokes black and milds on the weekends   Vaping Use  . Vaping Use: Never used  Substance and Sexual Activity  . Alcohol use: Yes    Comment: socially   . Drug use: Yes    Types: Marijuana    Comment: occasional use  . Sexual activity: Not Currently    Birth control/protection: None  Other Topics Concern  . Not on file  Social History Narrative   Lives alone   2 god babies    Mom in [place], [place]. , mom 24 in 2016    Social Determinants of Health   Financial Resource Strain:   . Difficulty of Paying Living Expenses: Not on file  Food Insecurity:   . Worried About Charity fundraiser in the Last Year: Not on file  .  Ran Out of Food in the Last Year: Not on file  Transportation Needs:   . Lack of Transportation (Medical): Not on file  . Lack of Transportation (Non-Medical): Not on file  Physical Activity:   . Days of Exercise per Week: Not on file  . Minutes of Exercise per Session: Not on file  Stress:   . Feeling of Stress : Not on file  Social Connections:   . Frequency of Communication with Friends and Family: Not on file  . Frequency of Social Gatherings with Friends and Family: Not on file  . Attends Religious Services: Not on file  . Active Member of Clubs or Organizations: Not on file  . Attends Archivist Meetings: Not on file  . Marital Status: Not on file  Intimate Partner Violence:   . Fear of Current or Ex-Partner: Not on file  . Emotionally Abused: Not on file  . Physically Abused: Not on file  . Sexually Abused: Not on file   Breast Cancer-relatedfamily history includes Breast cancer in her cousin and maternal aunt.  Vitals  BP 139/84   Pulse 80   Temp 98.2 F (36.8 C) (Oral)   Ht 5\' 5"  (1.651 m)   Wt 207 lb (93.9 kg)   SpO2 100%   BMI 34.45 kg/m   Gen: Alert and oriented x 3, no acute distress HEENT: [place]/AT, PERL no scleral icterus, no pale conjunctivae, hearing normal, oral mucosa moist Neck: Supple, no lymphadenopathy Cardio: Regular rate and rhythm; +S1 and S2; no murmurs, gallops, or rubs Resp: CTAB; no wheezes, rhonchi, or rales GI: Soft, nontender, nondistended, bowel sounds present Extremities: No cyanosis, clubbing, or edema; +2 PT and DP pulses, Left proximal forefinger is amputated  Skin: No rashes, lesions, or ecchymoses Neuro: No focal deficits; CNs II-XII intact; sensation intact; +5/5 MMS b/l UEs and LEs Psych: Calm, cooperative   Laboratory  CBC Latest Ref Rng & Units 08/02/2019 01/04/2019 12/20/2017  WBC 3.4 - 10.8 x10E3/uL 7.0 6.2 6.7  Hemoglobin 11.1 - 15.9 g/dL 11.3 10.4(L) 10.4(L)  Hematocrit 34.0 - 46.6 % 33.2(L) 34.1 31.7(L)  Platelets  150 - 450 x10E3/uL 272 348 358   CMP Latest Ref Rng & Units 01/17/2020 08/02/2019 01/04/2019  Glucose 65 - 99 mg/dL 92 97 91  BUN 6 - 24 mg/dL 13 13 13   Creatinine 0.57 - 1.00 mg/dL 0.91 0.90 0.95  Sodium 134 - 144 mmol/L 139 143 139  Potassium 3.5 - 5.2 mmol/L 4.1 5.2 4.2  Chloride 96 - 106 mmol/L 104 106 105  CO2 20 - 29 mmol/L 22 24 20   Calcium 8.7 - 10.2 mg/dL 9.1 9.5 9.1  Total Protein 6.0 - 8.5 g/dL 6.8 6.9 -  Total Bilirubin 0.0 - 1.2 mg/dL 0.3 0.2 -  Alkaline Phos 48 - 121 IU/L 73 78 -  AST 0 - 40 IU/L 32 28 -  ALT 0 - 32 IU/L 23 23 -    Assessment/Plan: Hepatitis C Orders Placed This Encounter  Procedures  . US ABDOMEN COMPLETE W/ELASTOGRAPHY    Order Specific Question:   Reason for Exam (SYMPTOM  OR DIAGNOSIS REQUIRED)    Answer:   Hepatitis C    Order Specific Question:   Preferred imaging location?    Answer:   [facility]  . Hepatitis C genotype  . Liver Fibrosis, FibroTest-ActiTest  . Protime-INR  . HIV antibody (with reflex)  . Hepatitis B surface antibody,qualitative  . Hepatitis B surface antigen  . Hepatitis B core antibody, total  . CBC  . Hepatitis B core antibody, IgM   Discussed the pathogenesis, transmission, prevention, risks of left untreated, and treatment options for hepatitis C Follow up in 4 weeks with Cassie for initiation of HCV treatment   Smoking/Alcohol/Substance use  Counseling Alcohol Cessation  Patient's labs were reviewed as well as his previous records. Patients questions were addressed and answered.   Rosiland Oz, MD Infectious Diseases  Office phone [phone] Fax no. [phone]

## 2020-03-07 LAB — LIVER FIBROSIS, FIBROTEST-ACTITEST
ALT: 20 U/L (ref 6–29)
Alpha-2-Macroglobulin: 226 mg/dL (ref 106–279)
Apolipoprotein A1: 211 mg/dL — ABNORMAL HIGH (ref 101–198)
Bilirubin: 0.3 mg/dL (ref 0.2–1.2)
Fibrosis Score: 0.08
GGT: 58 U/L (ref 3–70)
Haptoglobin: 219 mg/dL — ABNORMAL HIGH (ref 43–212)
Necroinflammat ACT Score: 0.06
Reference ID: 3549595

## 2020-03-07 LAB — PROTIME-INR
INR: 0.9
Prothrombin Time: 9.8 s (ref 9.0–11.5)

## 2020-03-07 LAB — HEPATITIS B SURFACE ANTIBODY,QUALITATIVE: Hep B S Ab: NONREACTIVE

## 2020-03-07 LAB — HEPATITIS C GENOTYPE

## 2020-03-07 LAB — HEPATITIS B CORE ANTIBODY, TOTAL: Hep B Core Total Ab: NONREACTIVE

## 2020-03-07 LAB — CBC
HCT: 35.4 % (ref 35.0–45.0)
Hemoglobin: 12 g/dL (ref 11.7–15.5)
MCH: 31.8 pg (ref 27.0–33.0)
MCHC: 33.9 g/dL (ref 32.0–36.0)
MCV: 93.9 fL (ref 80.0–100.0)
MPV: 11.8 fL (ref 7.5–12.5)
Platelets: 314 10*3/uL (ref 140–400)
RBC: 3.77 10*6/uL — ABNORMAL LOW (ref 3.80–5.10)
RDW: 12.4 % (ref 11.0–15.0)
WBC: 8 10*3/uL (ref 3.8–10.8)

## 2020-03-07 LAB — HEPATITIS B CORE ANTIBODY, IGM: Hep B C IgM: NONREACTIVE

## 2020-03-07 LAB — HIV ANTIBODY (ROUTINE TESTING W REFLEX): HIV 1&2 Ab, 4th Generation: NONREACTIVE

## 2020-03-07 LAB — HEPATITIS B SURFACE ANTIGEN: Hepatitis B Surface Ag: NONREACTIVE

## 2020-03-11 ENCOUNTER — Telehealth: Payer: Self-pay

## 2020-03-11 ENCOUNTER — Other Ambulatory Visit: Payer: Self-pay | Admitting: Pharmacist

## 2020-03-11 DIAGNOSIS — B182 Chronic viral hepatitis C: Secondary | ICD-10-CM

## 2020-03-11 MED ORDER — MAVYRET 100-40 MG PO TABS: 3.0000 | ORAL_TABLET | Freq: Every day | ORAL | 1 refills | Status: DC

## 2020-03-11 NOTE — Telephone Encounter (Signed)
[facility] Patient Advocate Encounter   Received notification from [employer] that prior authorization for MAVYRET is required.   PA submitted on 03/11/2020 Key [id number] Status is pending    [facility] will continue to follow.   Ileene Patrick, [facility] Patient [facility] Phone: [phone] Fax:  [phone]

## 2020-03-11 NOTE — Progress Notes (Signed)
Sending to [employer]. Butch Penny will follow up to see if patient needs PA or copay assistance.

## 2020-03-11 NOTE — Progress Notes (Signed)
Will do. Thank you

## 2020-03-13 ENCOUNTER — Telehealth: Payer: Self-pay

## 2020-03-13 NOTE — Telephone Encounter (Signed)
[facility] Patient Advocate Encounter  Prior Authorization for Mayrvet has been approved.     Effective dates: 03/13/20 through 05/08/20    [facility] will continue to follow.  Ileene Patrick, [facility] Patient [facility] Phone: [phone] Fax:  [phone]

## 2020-03-20 ENCOUNTER — Other Ambulatory Visit: Payer: Self-pay

## 2020-03-20 ENCOUNTER — Ambulatory Visit (HOSPITAL_COMMUNITY)
Admission: RE | Admit: 2020-03-20 | Discharge: 2020-03-20 | Disposition: A | Discharge status: Home / Self Care | Payer: 59 | Source: Ambulatory Visit | Attending: Infectious Diseases | Admitting: Infectious Diseases

## 2020-03-20 DIAGNOSIS — B182 Chronic viral hepatitis C: Secondary | ICD-10-CM | POA: Insufficient documentation

## 2020-03-26 ENCOUNTER — Telehealth: Payer: Self-pay | Admitting: *Deleted

## 2020-03-26 NOTE — Telephone Encounter (Signed)
Received call from [place], pharmacist at [facility], regarding drug-drug interaction between atorvastatin 20 mg and mavyret.  Per Cassie, ok for patient to hold atorvastatin 20 mg while on mavyret.  Angie will relay to patient.  Call back is [phone] with any questions. Landis Gandy, RN

## 2020-04-02 ENCOUNTER — Telehealth: Payer: Self-pay | Admitting: Pharmacist

## 2020-04-02 ENCOUNTER — Encounter: Payer: Self-pay | Admitting: Pharmacist

## 2020-04-02 ENCOUNTER — Telehealth: Payer: Self-pay | Admitting: Nurse Practitioner

## 2020-04-02 NOTE — Telephone Encounter (Signed)
Sure that's fine to hold statin.

## 2020-04-02 NOTE — Telephone Encounter (Signed)
Copied from [place] [id number]. Topic: Quick Communication - See Telephone Encounter >> Apr 02, 2020 11:19 AM [facility] wrote: CRM for notification. See Telephone encounter for: 04/02/20. Pt was given new drug for Hepatis C but pharmacy said not to take cholesterol med. Wants to make sure Dr is okay with pause. It will be 8 wks pause. Please FU with her at [phone].

## 2020-04-02 NOTE — Telephone Encounter (Signed)
Spoke to patient and informed on PCP advising. Pt. Understood.  

## 2020-04-02 NOTE — Telephone Encounter (Signed)
Patient is approved to receive Mavyret x 8 weeks for chronic Hepatitis C infection. Counseled patient to take all three tablets of Mavyret daily with food.  Counseled patient the need to take all three tablets together and to not separate them out during the day. Encouraged patient not to miss any doses and explained how their chance of cure could go down with each dose missed. Counseled patient on what to do if dose is missed - if it is closer to the missed dose take immediately; if closer to next dose then skip dose and take the next dose at the usual time.   Counseled patient on common side effects such as headache, fatigue, and nausea and that these normally decrease with time. I reviewed patient medications and found a drug interaction with her Lipitor. The pharmacist at [facility] instructed her to stop taking when she takes the [place] and to reach out to the prescribing doctor. I asked her to do the same. Explained that [facility] prescribed the cholesterol medication and to call them to either be placed on another one or let them know that she will not be taking it for 2 months. Discussed with patient that there are several drug interactions with Mavyret and instructed patient to call the clinic if she wishes to start a new medication during course of therapy. Also advised patient to call if she experiences any side effects. Patient will follow-up with me in the pharmacy clinic on 11/23.

## 2020-04-03 ENCOUNTER — Ambulatory Visit: Payer: 59 | Admitting: Pharmacist

## 2020-04-03 ENCOUNTER — Ambulatory Visit: Payer: 59 | Admitting: Infectious Diseases

## 2020-04-05 ENCOUNTER — Other Ambulatory Visit: Payer: 59

## 2020-04-05 DIAGNOSIS — Z20822 Contact with and (suspected) exposure to covid-19: Secondary | ICD-10-CM

## 2020-04-06 LAB — NOVEL CORONAVIRUS, NAA: SARS-CoV-2, NAA: NOT DETECTED

## 2020-04-06 LAB — SARS-COV-2, NAA 2 DAY TAT

## 2020-04-24 NOTE — Progress Notes (Signed)
Sounds good. She sees me in a few weeks, so I will check then. Thank you!

## 2020-04-24 NOTE — Progress Notes (Signed)
Will need to check for Hep A antibody total in next visit with Cassie. If negative, will need to start HepA/B vaccine

## 2020-05-01 ENCOUNTER — Other Ambulatory Visit: Payer: Self-pay | Admitting: Nurse Practitioner

## 2020-05-01 DIAGNOSIS — I10 Essential (primary) hypertension: Secondary | ICD-10-CM

## 2020-05-01 MED FILL — AMLODIPINE BESYLATE 10 MG T: 10 | 90 days supply | Qty: 90 | Fill #0

## 2020-05-14 ENCOUNTER — Ambulatory Visit (INDEPENDENT_AMBULATORY_CARE_PROVIDER_SITE_OTHER): Payer: 59 | Admitting: Pharmacist

## 2020-05-14 ENCOUNTER — Other Ambulatory Visit: Payer: Self-pay

## 2020-05-14 DIAGNOSIS — Z23 Encounter for immunization: Secondary | ICD-10-CM | POA: Diagnosis not present

## 2020-05-14 DIAGNOSIS — B182 Chronic viral hepatitis C: Secondary | ICD-10-CM | POA: Diagnosis not present

## 2020-05-14 NOTE — Progress Notes (Signed)
HPI: Kila Godina is a 54 y.o. female who presents to the [facility] for Hepatitis C follow-up.  Medication: Mavyret x 8 weeks  Start Date: 04/03/20  Hepatitis C Genotype: 1a  Fibrosis Score: F0  Hepatitis C RNA: 295,000  Patient Active Problem List   Diagnosis Date Noted  . 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 Amputation of finger of left hand 09/07/2016  . Mild anemia 12/17/2014  . Vitamin D deficiency 12/17/2014  . Essential hypertension 12/12/2014  . Tinea corporis 12/12/2014  . Allergic rhinitis 12/12/2014  . Fibroid uterus 12/06/2013  . Obesity (BMI 30-39.9) 10/09/2013    Patient's Medications  New Prescriptions   No medications on file  Previous Medications   AMLODIPINE (NORVASC) 10 MG TABLET    TAKE 1 TABLET (10 MG TOTAL) BY MOUTH DAILY.   ATORVASTATIN (LIPITOR) 20 MG TABLET    Take 1 tablet (20 mg total) by mouth daily.   CYCLOBENZAPRINE (FLEXERIL) 10 MG TABLET    Take 1 tablet (10 mg total) by mouth at bedtime.   FLUTICASONE (FLONASE) 50 MCG/ACT NASAL SPRAY    PLACE 2 SPRAYS INTO BOTH NOSTRILS DAILY.   GLECAPREVIR-PIBRENTASVIR (MAVYRET) 100-40 MG TABS    Take 3 tablets by mouth daily with breakfast.  Modified Medications   No medications on file  Discontinued Medications   No medications on file    Allergies: Allergies  Allergen Reactions  . Keflex [Cephalexin]     Severe rash    Past Medical History: Past Medical History:  Diagnosis Date  . Abnormal Pap smear    repeat pap  . Anemia    as child   . Hyperlipidemia   . Hypertension     Social History: Social History   Socioeconomic History  . Marital status: Single    Spouse name: Not on file  . Number of children: 0  . Years of education: college   . Highest education level: Not on file  Occupational History  . Occupation: Early Childhood education    Employer: [employer]    Comment:  part time, loader, manual work   Tobacco Use  . Smoking status: Former Research scientist (life sciences)  . Smokeless tobacco: Never Used  . Tobacco comment: smokes black and milds on the weekends   Vaping Use  . Vaping Use: Never used  Substance and Sexual Activity  . Alcohol use: Yes    Comment: socially   . Drug use: Yes    Types: Marijuana    Comment: occasional use  . Sexual activity: Not Currently    Birth control/protection: None  Other Topics Concern  . Not on file  Social History Narrative   Lives alone   2 god babies    Mom in [place], [place]. , mom 26 in 2016    Social Determinants of Health   Financial Resource Strain:   . Difficulty of Paying Living Expenses: Not on file  Food Insecurity:   . Worried About Charity fundraiser in the Last Year: Not on file  . Ran Out of Food in the Last Year: Not on file  Transportation Needs:   . Lack of Transportation (Medical): Not on file  . Lack of Transportation (Non-Medical): Not on file  Physical Activity:   . Days of Exercise per Week: Not on file  . Minutes of Exercise per Session: Not on file  Stress:   . Feeling of  Stress : Not on file  Social Connections:   . Frequency of Communication with Friends and Family: Not on file  . Frequency of Social Gatherings with Friends and Family: Not on file  . Attends Religious Services: Not on file  . Active Member of Clubs or Organizations: Not on file  . Attends Archivist Meetings: Not on file  . Marital Status: Not on file    Labs: Hepatitis C Lab Results  Component Value Date   HCVGENOTYPE 1a 02/29/2020   FIBROSTAGE F0 02/29/2020   Hepatitis B Lab Results  Component Value Date   HEPBSAB NON-REACTIVE 02/29/2020   HEPBSAG NON-REACTIVE 02/29/2020   HEPBCAB NON-REACTIVE 02/29/2020   Hepatitis A No results found for: HAV HIV Lab Results  Component Value Date   HIV NON-REACTIVE 02/29/2020   HIV NONREACTIVE 12/12/2014   Lab Results  Component Value Date   CREATININE 0.91  01/17/2020   CREATININE 0.90 08/02/2019   CREATININE 0.95 01/04/2019   CREATININE 0.98 12/20/2017   CREATININE 0.92 08/07/2016   Lab Results  Component Value Date   AST 32 01/17/2020   AST 28 08/02/2019   AST 26 12/20/2017   ALT 20 02/29/2020   ALT 23 01/17/2020   ALT 23 08/02/2019   INR 0.9 02/29/2020    Assessment: Ladon presents to clinic today for her 71-month Hep C follow-up. She has been taking Mavyret appropriately without any missed doses. She initially experienced headaches that faded after a couple of weeks but still has some ongoing nausea. I recommended taking the [place] with food to mitigate that. She has ~3 weeks left of treatment. Will check HCV RNA today and have her follow-up with Dr. West Bali in ~1 month for her end of treatment appointment.  She is not immune to hepatitis B, so will start that series today. Will finish the series at her next appointment. Also will check hepatitis A antibodies today. She asked about getting her [place] booster which she is due for (received her second shot in March) and is unable to schedule for 12/10. I told her to call the front desk for scheduling later if she would like or she could go to any nearby pharmacy to receive. She has already received her flu shot this year.   Plan: Check HCV RNA and HAV antibodies Administer HBV vaccine #1 Follow-up with Dr. West Bali on 06/26/20 @ 1:45PM (due for 2nd Hep B shot here)  Alfonse Spruce, PharmD PGY2 ID Pharmacy Resident [phone]  05/14/2020, 4:07 PM

## 2020-05-17 LAB — HEPATITIS C RNA QUANTITATIVE
HCV Quantitative Log: 1.39 log IU/mL — ABNORMAL HIGH
HCV RNA, PCR, QN: 24 IU/mL — ABNORMAL HIGH

## 2020-05-17 LAB — HEPATITIS A ANTIBODY, TOTAL: Hepatitis A AB,Total: NONREACTIVE

## 2020-06-26 ENCOUNTER — Encounter: Payer: Self-pay | Admitting: Infectious Diseases

## 2020-06-26 ENCOUNTER — Ambulatory Visit (INDEPENDENT_AMBULATORY_CARE_PROVIDER_SITE_OTHER): Payer: 59 | Admitting: Infectious Diseases

## 2020-06-26 ENCOUNTER — Other Ambulatory Visit: Payer: Self-pay

## 2020-06-26 VITALS — BP 132/83 | HR 72 | Temp 98.2°F | Ht 65.0 in | Wt 213.0 lb

## 2020-06-26 DIAGNOSIS — Z23 Encounter for immunization: Secondary | ICD-10-CM | POA: Diagnosis not present

## 2020-06-26 DIAGNOSIS — B182 Chronic viral hepatitis C: Secondary | ICD-10-CM

## 2020-06-26 DIAGNOSIS — K769 Liver disease, unspecified: Secondary | ICD-10-CM | POA: Diagnosis not present

## 2020-06-26 NOTE — Addendum Note (Signed)
Addended by: Linna Hoff D on: 06/26/2020 02:53 PM   Modules accepted: Orders

## 2020-06-26 NOTE — Progress Notes (Signed)
[facility] for Infectious Diseases                                      [address], [place], [place], [address]                                               Phn. [phone]; Fax: [phone]                                                               Date: 06/26/2020  Reason for Follow Up: Hepatitis C end of treatment   HPI: Autumn Gomez is a 55 y.o.old female with a PMH of HCV who is here for follow up in the setting of completion of HCV treatment.   She was started on Mavyret on 04/03/20 and was planned to complete a course of 8 weeks which she completed on 05/29/20. Denies missing any doses of Mavyret. No complaints today. Feels well.  She also received 1st dose of Hep B Vaccine and is due for Hep B # 2 today. She is also non immune to Hep A.  Discussed about re-infection with Hep C. She thinks she could have an infected sexual partner that transferred her Hep C. She was asking if she could start her atorvastatin now that she has completed HCV treatment which is reasonable from my end.   Smokes marijuana, denies alcohol and no other drugs. Denies being sexually active lately.  ROS: Denies yellowish discoloration of sclera and skin, abdominal pain/distension, hematemesis.            Dcough, fever, chills, nightsweats, nausea, vomiting, diarrhea, constipation, weight loss, recent hospitalizations, rashes, joint complaints, shortness of breath, chest pain, headaches, dysuria .  Current Outpatient Medications on File Prior to Visit  Medication Sig Dispense Refill  . amLODipine (NORVASC) 10 MG tablet TAKE 1 TABLET (10 MG TOTAL) BY MOUTH DAILY. 90 tablet 0  . cyclobenzaprine (FLEXERIL) 10 MG tablet Take 1 tablet (10 mg total) by mouth at bedtime. 30 tablet 0  . fluticasone (FLONASE) 50 MCG/ACT nasal spray PLACE 2 SPRAYS INTO BOTH NOSTRILS DAILY. 16 g 2  . atorvastatin (LIPITOR) 20 MG tablet Take 1 tablet (20 mg total) by mouth  daily. (Patient not taking: Reported on 06/26/2020) 90 tablet 0  . Glecaprevir-Pibrentasvir (MAVYRET) 100-40 MG TABS Take 3 tablets by mouth daily with breakfast. (Patient not taking: No sig reported) 84 tablet 1   Current Facility-Administered Medications on File Prior to Visit  Medication Dose Route Frequency Provider Last Rate Last Admin  . 0.9 %  sodium chloride infusion  500 mL Intravenous Once [place] Park, MD         Allergies  Allergen Reactions  . Keflex [Cephalexin]     Severe rash    Past Medical History:  Diagnosis Date  . Abnormal Pap smear    repeat pap  . Anemia    as child   . Hyperlipidemia   . Hypertension    Past Surgical History:  Procedure Laterality Date  . BREAST EXCISIONAL BIOPSY Left   .  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 left breast cyst  Left 1986   Social History   Socioeconomic History  . Marital status: Single    Spouse name: Not on file  . Number of children: 0  . Years of education: college   . Highest education level: Not on file  Occupational History  . Occupation: Early Childhood education    Employer: [employer]    Comment: part time, loader, manual work   Tobacco Use  . Smoking status: Former Research scientist (life sciences)  . Smokeless tobacco: Never Used  . Tobacco comment: smokes black and milds on the weekends   Vaping Use  . Vaping Use: Never used  Substance and Sexual Activity  . Alcohol use: Yes    Comment: socially   . Drug use: Yes    Types: Marijuana    Comment: occasional use  . Sexual activity: Not Currently    Birth control/protection: None  Other Topics Concern  . Not on file  Social History Narrative   Lives alone   2 god babies    Mom in [place], [place]. , mom 71 in 2016    Social Determinants of Health   Financial Resource Strain: Not on file  Food Insecurity: Not on file  Transportation Needs: Not on file  Physical Activity: Not on file  Stress: Not on file  Social Connections: Not on file  Intimate Partner  Violence: Not on file    Physical exam: BP 132/83   Pulse 72   Temp 98.2 F (36.8 C) (Oral)   Ht 5\' 5"  (1.651 m)   Wt 213 lb (96.6 kg)   SpO2 100%   BMI 35.45 kg/m   Gen: Alert and oriented x 3, no acute distress HEENT: [place]/AT, PERL, no scleral icterus, no pale conjunctivae, hearing normal, oral mucosa moist Neck: Supple, no lymphadenopathy Cardio: Regular rate and rhythm; +S1 and S2; no murmurs, gallops, or rubs Resp: CTAB; no wheezes, rhonchi, or rales GI: Soft, nontender, nondistended, bowel sounds present GU: Musc: Extremities: No cyanosis, clubbing, or edema; +2 PT and DP pulses Skin: a single circular hypopigmented area in the left arm - non painful, tender, warmth, no itching ( improving per patient). Sensation present in the circular area  Neuro: No focal deficits Psych: Calm, cooperative  Laboratory  CBC Latest Ref Rng & Units 02/29/2020 08/02/2019 01/04/2019  WBC 3.8 - 10.8 Thousand/uL 8.0 7.0 6.2  Hemoglobin 11.7 - 15.5 g/dL 12.0 11.3 10.4(L)  Hematocrit 35.0 - 45.0 % 35.4 33.2(L) 34.1  Platelets 140 - 400 Thousand/uL 314 272 348   CMP Latest Ref Rng & Units 02/29/2020 01/17/2020 08/02/2019  Glucose 65 - 99 mg/dL - 92 97  BUN 6 - 24 mg/dL - 13 13  Creatinine 0.57 - 1.00 mg/dL - 0.91 0.90  Sodium 134 - 144 mmol/L - 139 143  Potassium 3.5 - 5.2 mmol/L - 4.1 5.2  Chloride 96 - 106 mmol/L - 104 106  CO2 20 - 29 mmol/L - 22 24  Calcium 8.7 - 10.2 mg/dL - 9.1 9.5  Total Protein 6.0 - 8.5 g/dL - 6.8 6.9  Total Bilirubin 0.0 - 1.2 mg/dL - 0.3 0.2  Alkaline Phos 48 - 121 IU/L - 73 78  AST 0 - 40 IU/L - 32 28  ALT 6 - 29 U/L 20 23 23     Assessment/Plan: # Chronic Hepatitis C, Genotype 1 a, F0 - S/p completion of 8 weeks of Mavyret on 05/29/20 - Will  check for SVR in 3 months  - I will also get an MRI abdomen given abnormality in US liver - Fu in 3 months   # Immunization  -Hep B vaccine #2 -Hep A vaccine #1   Counseling done on the following -Transmission (avoid  sharing personal hygiene equipment) -Avoid hepatotoxins like alcohol and excessive acetamaminphen (no more than 2 gram a day) -Avoid eating raw sea food -Risks of re-infection  -Hepatitis coinfection and vaccination  I spent greater than 25 minutes with the patient including review of prior medical records with greater than 50% of time in face to face counsel of the patient.    Patient's labs were reviewed as well as his previous records. Patients questions were addressed and answered.   Electronically signed by:  Odette Fraction, MD Infectious Diseases  Office phone [phone] Fax no. [phone]

## 2020-07-02 IMAGING — DX LEFT THUMB 2+V
3 series · 3 of 3 positions shown · non-contrast
Comparison: Left hand dated 11/29/2008.

CLINICAL DATA: Left thumb pain and deformity for several months. No
known injury.

EXAM:
LEFT THUMB 2+V

[x finger pa left]
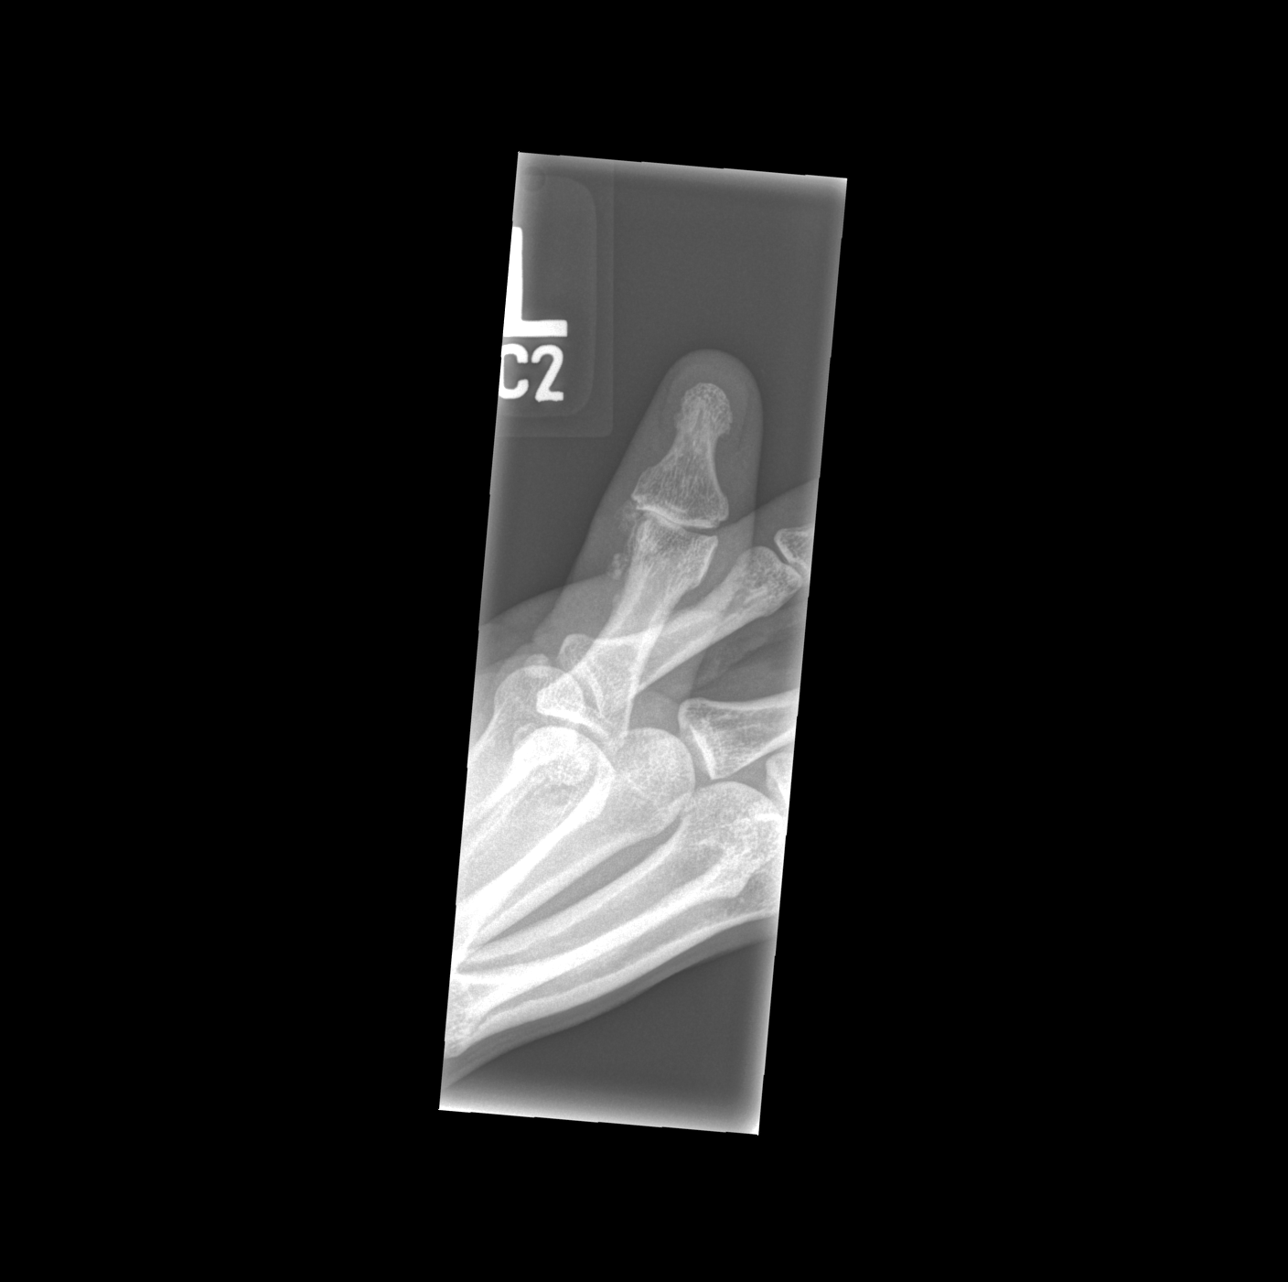

[x finger obl left]
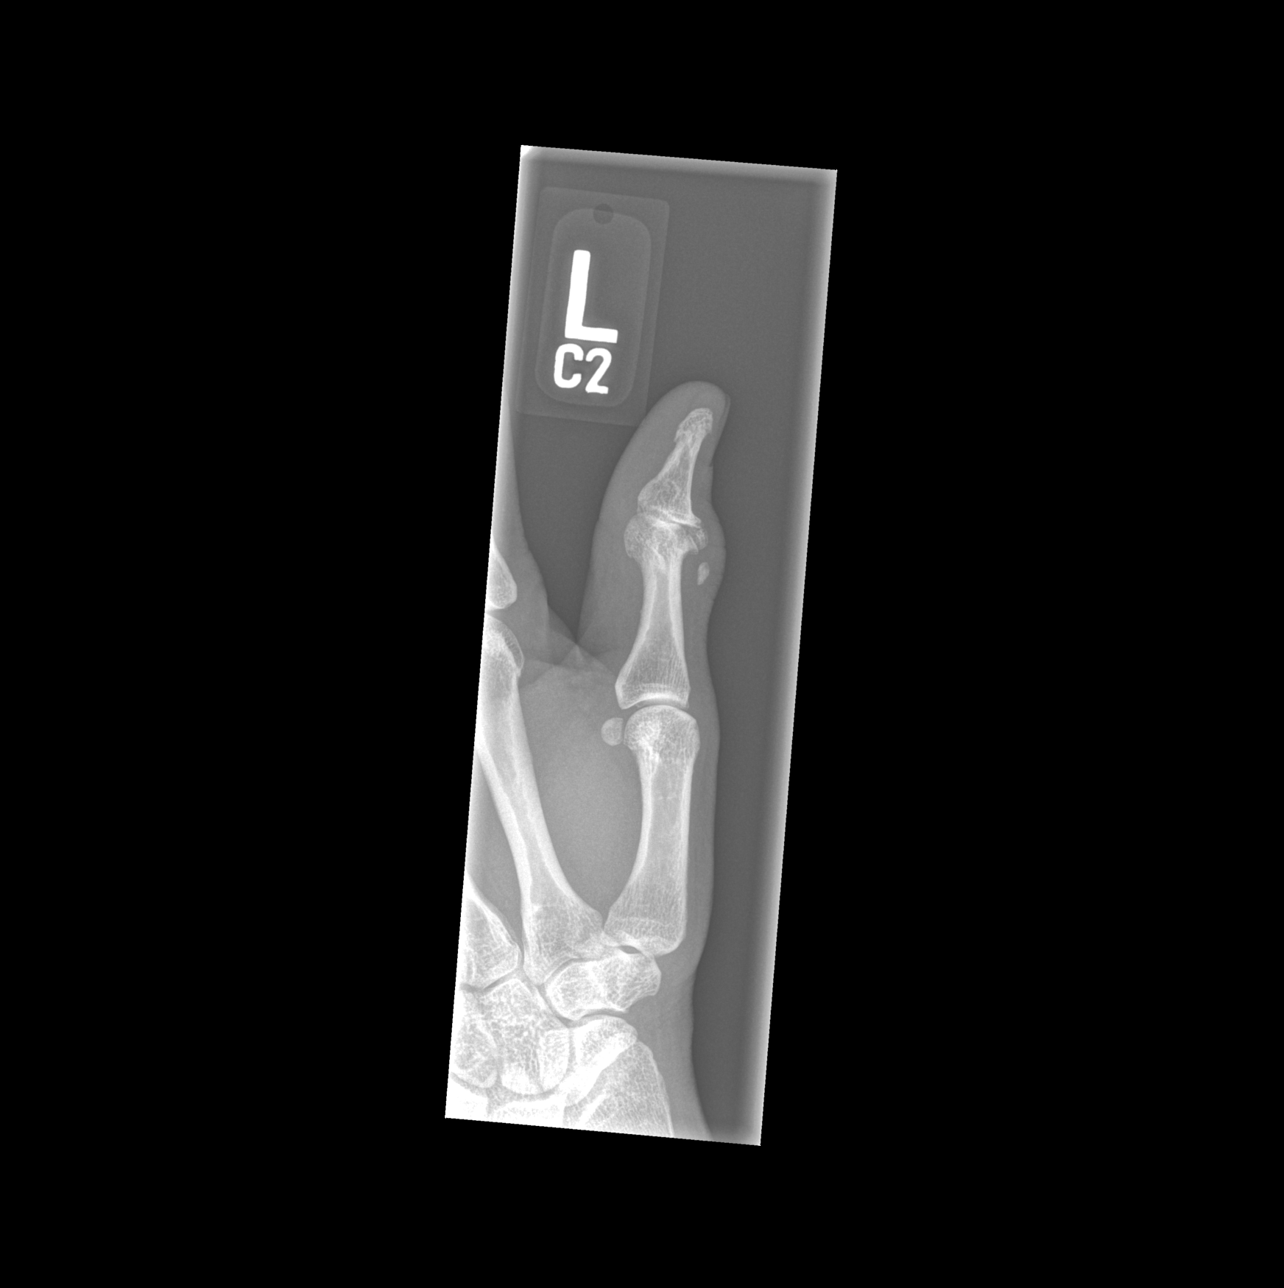

[x finger lat left]
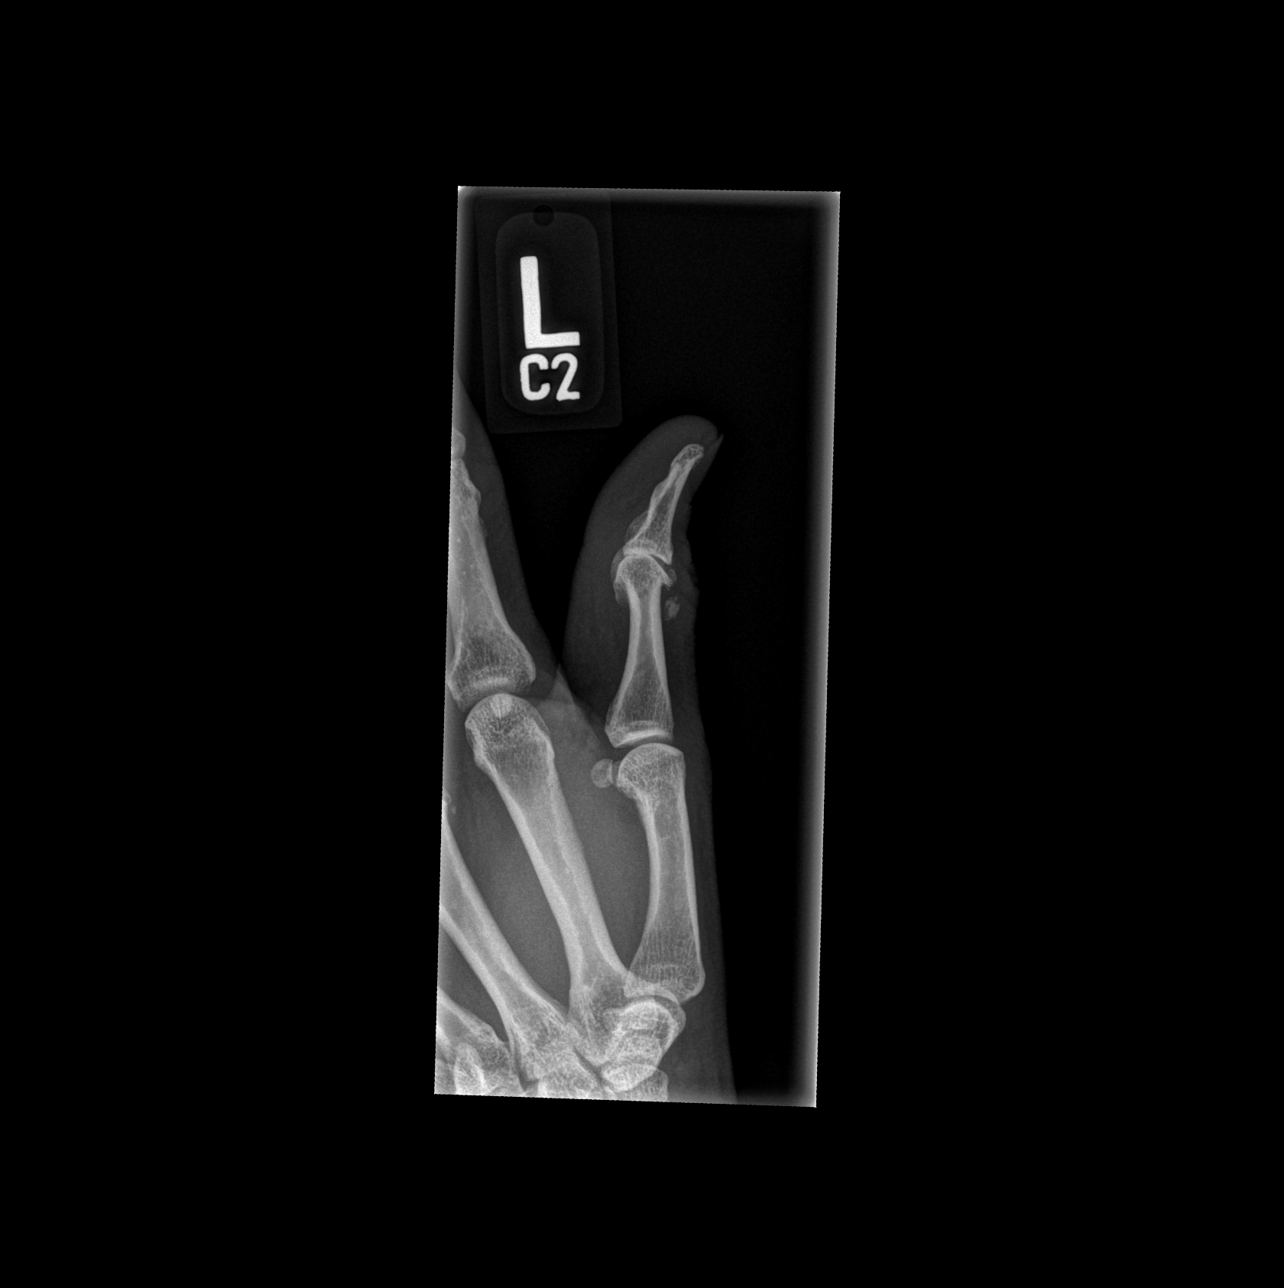

[3 of 3 positions shown; findings below may reference images not displayed]

FINDINGS: Interval marked joint space narrowing involving the 1st
interphalangeal joint with associated spur formation and areas of
soft tissue calcifications/ossification. Associated mild radial
subluxation of the distal phalanx. No fracture.
IMPRESSION: Interval marked degenerative changes involving the 1st
interphalangeal joint with associated mild radial subluxation of the
distal phalanx and soft tissue calcifications/ossifications.

## 2020-07-09 ENCOUNTER — Telehealth: Payer: Self-pay | Admitting: Nurse Practitioner

## 2020-07-09 NOTE — Telephone Encounter (Signed)
Called patient and LVM advising her to call back [phone] to schedule an appointment with Geryl Rankins.    Patient had requested appointment with Zelda to discuss a referral for an MRI.

## 2020-07-10 ENCOUNTER — Encounter: Payer: Self-pay | Admitting: Nurse Practitioner

## 2020-07-17 ENCOUNTER — Ambulatory Visit (HOSPITAL_COMMUNITY)
Admission: EM | Admit: 2020-07-17 | Discharge: 2020-07-17 | Disposition: A | Discharge status: Home / Self Care | Payer: 59 | Attending: Internal Medicine | Admitting: Internal Medicine

## 2020-07-17 ENCOUNTER — Encounter (HOSPITAL_COMMUNITY): Payer: Self-pay | Admitting: Emergency Medicine

## 2020-07-17 ENCOUNTER — Other Ambulatory Visit: Payer: Self-pay

## 2020-07-17 ENCOUNTER — Other Ambulatory Visit: Payer: Self-pay | Admitting: Family Medicine

## 2020-07-17 DIAGNOSIS — Z87891 Personal history of nicotine dependence: Secondary | ICD-10-CM | POA: Diagnosis not present

## 2020-07-17 DIAGNOSIS — Z79899 Other long term (current) drug therapy: Secondary | ICD-10-CM | POA: Diagnosis not present

## 2020-07-17 DIAGNOSIS — Z23 Encounter for immunization: Secondary | ICD-10-CM

## 2020-07-17 DIAGNOSIS — J309 Allergic rhinitis, unspecified: Secondary | ICD-10-CM

## 2020-07-17 DIAGNOSIS — Z881 Allergy status to other antibiotic agents status: Secondary | ICD-10-CM | POA: Insufficient documentation

## 2020-07-17 DIAGNOSIS — J069 Acute upper respiratory infection, unspecified: Secondary | ICD-10-CM

## 2020-07-17 DIAGNOSIS — R519 Headache, unspecified: Secondary | ICD-10-CM | POA: Diagnosis present

## 2020-07-17 DIAGNOSIS — U071 COVID-19: Secondary | ICD-10-CM | POA: Insufficient documentation

## 2020-07-17 NOTE — Telephone Encounter (Signed)
Requested medication (s) are due for refill today: Yes  Requested medication (s) are on the active medication list: Yes  Last refill:  03/01/19  Future visit scheduled: No  Notes to clinic:  Unable to refill per protocol, expired Rx     Requested Prescriptions  Pending Prescriptions Disp Refills   fluticasone (FLONASE) 50 MCG/ACT nasal spray [Pharmacy Med Name: FLUTICASONE PROP 50 MCG SPR 50 SUS] 16 g 2    Sig: PLACE 2 SPRAYS INTO BOTH NOSTRILS DAILY.      Ear, Nose, and Throat: Nasal Preparations - Corticosteroids Passed - 07/17/2020  3:27 PM      Passed - Valid encounter within last 12 months    Recent Outpatient Visits           6 months ago Encounter for Papanicolaou smear for cervical cancer screening   [place], Vernia Buff, NP   11 months ago Abnormal uterine bleeding   [place], Vernia Buff, NP   1 year ago Encounter for annual physical exam   [place], Vernia Buff, NP   1 year ago Acute pain of left shoulder   [place], MD   2 years ago Essential hypertension   [place], Vernia Buff, NP

## 2020-07-17 NOTE — ED Triage Notes (Signed)
Pt presents with headache, nasal congestion, and cough xs 3 days.

## 2020-07-17 NOTE — ED Provider Notes (Signed)
[facility]    CSN: [id number] Arrival date & time: 07/17/20  1631      History   Chief Complaint Chief Complaint  Patient presents with  . Nasal Congestion  . Headache  . Cough    HPI Autumn Gomez is a 55 y.o. female comes to urgent care with 3 days history of headaches, nasal congestion and nonproductive cough.  Patient denies any loss of taste or smell.  She has been fully vaccinated against COVID-19 virus.  No fever or chills.  No nausea, vomiting or diarrhea.  No abdominal pain.   No chest pain or chest pressure.  Patient works in a daycare.  HPI  Past Medical History:  Diagnosis Date  . Abnormal Pap smear    repeat pap  . Anemia    as child   . Hyperlipidemia   . Hypertension     Patient Active Problem List   Diagnosis Date Noted  . 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 Amputation of finger of left hand 09/07/2016  . Mild anemia 12/17/2014  . Vitamin D deficiency 12/17/2014  . Essential hypertension 12/12/2014  . Tinea corporis 12/12/2014  . Allergic rhinitis 12/12/2014  . Fibroid uterus 12/06/2013  . Obesity (BMI 30-39.9) 10/09/2013    Past Surgical History:  Procedure Laterality Date  . BREAST EXCISIONAL BIOPSY Left   . 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 left breast cyst  Left 1986    OB History    Gravida  0   Para  0   Term  0   Preterm  0   AB  0   Living  0     SAB  0   IAB  0   Ectopic  0   Multiple  0   Live Births               Home Medications    Prior to Admission medications   Medication Sig Start Date End Date Taking? Authorizing Provider  amLODipine (NORVASC) 10 MG tablet TAKE 1 TABLET (10 MG TOTAL) BY MOUTH DAILY. 05/01/20 07/30/20  Gildardo Pounds, NP  cyclobenzaprine (FLEXERIL) 10 MG tablet Take 1 tablet (10 mg total) by mouth at bedtime. 12/27/18   Charlott Rakes, MD  fluticasone (FLONASE) 50  MCG/ACT nasal spray PLACE 2 SPRAYS INTO BOTH NOSTRILS DAILY. 03/01/19   Charlott Rakes, MD  atorvastatin (LIPITOR) 20 MG tablet Take 1 tablet (20 mg total) by mouth daily. Patient not taking: Reported on 06/26/2020 01/17/20 07/17/20  Gildardo Pounds, NP    Family History Family History  Problem Relation Age of Onset  . Cancer Father        lung  . Hypertension Father   . Hypertension Mother   . Breast cancer Maternal Aunt   . Breast cancer Cousin   . Colon cancer Neg Hx   . Esophageal cancer Neg Hx   . Stomach cancer Neg Hx   . Rectal cancer Neg Hx     Social History Social History   Tobacco Use  . Smoking status: Former Research scientist (life sciences)  . Smokeless tobacco: Never Used  . Tobacco comment: smokes black and milds on the weekends   Vaping Use  . Vaping Use: Never used  Substance Use Topics  . Alcohol use: Yes    Comment: socially   . Drug use: Yes    Types: Marijuana  Comment: occasional use     Allergies   Keflex [cephalexin]   Review of Systems Review of Systems  Constitutional: Negative.   HENT: Positive for congestion and sore throat.   Respiratory: Positive for cough.   Gastrointestinal: Negative.   Genitourinary: Negative.   Neurological: Positive for headaches.     Physical Exam Triage Vital Signs ED Triage Vitals  Enc Vitals Group     BP 07/17/20 1659 (!) 161/102     Pulse Rate 07/17/20 1659 82     Resp 07/17/20 1659 16     Temp 07/17/20 1659 98.9 F (37.2 C)     Temp Source 07/17/20 1659 Oral     SpO2 07/17/20 1659 100 %     Weight --      Height --      Head Circumference --      Peak Flow --      Pain Score 07/17/20 1656 0     Pain Loc --      Pain Edu? --      Excl. in [place]? --    No data found.  Updated Vital Signs BP (!) 161/102 (BP Location: Right Arm)   Pulse 82   Temp 98.9 F (37.2 C) (Oral)   Resp 16   SpO2 100%   Visual Acuity Right Eye Distance:   Left Eye Distance:   Bilateral Distance:    Right Eye Near:   Left Eye Near:     Bilateral Near:     Physical Exam Vitals and nursing note reviewed.  Constitutional:      General: She is not in acute distress.    Appearance: She is not ill-appearing.  Cardiovascular:     Rate and Rhythm: Normal rate and regular rhythm.     Heart sounds: Normal heart sounds.  Pulmonary:     Effort: Pulmonary effort is normal.  Abdominal:     Palpations: Abdomen is soft.  Neurological:     Mental Status: She is alert.     GCS: GCS eye subscore is 4. GCS verbal subscore is 5. GCS motor subscore is 6.      UC Treatments / Results  Labs (all labs ordered are listed, but only abnormal results are displayed) Labs Reviewed  SARS CORONAVIRUS 2 (TAT 6-24 HRS)    EKG   Radiology No results found.  Procedures Procedures (including critical care time)  Medications Ordered in UC Medications - No data to display  Initial Impression / Assessment and Plan / UC Course  I have reviewed the triage vital signs and the nursing notes.  Pertinent labs & imaging results that were available during my care of the patient were reviewed by me and considered in my medical decision making (see chart for details).     1.  Viral URI with cough: COVID-19 PCR sent Tylenol Motrin as needed for pain or fever Please quarantine until COVID-19 test results are available We will call you with lab results if abnormal. Final Clinical Impressions(s) / UC Diagnoses   Final diagnoses:  Viral URI with cough     Discharge Instructions     Please quarantine until COVID-19 test results are available Increase oral fluid intake Tylenol or Motrin as needed for pain or fever If symptoms worsen please return to urgent care to be reevaluated.   ED Prescriptions    None     PDMP not reviewed this encounter.   Chase Picket, MD 07/17/20 [phone]

## 2020-07-17 NOTE — Discharge Instructions (Addendum)
Please quarantine until COVID-19 test results are available Increase oral fluid intake Tylenol or Motrin as needed for pain or fever If symptoms worsen please return to urgent care to be reevaluated.

## 2020-07-18 LAB — SARS CORONAVIRUS 2 (TAT 6-24 HRS): SARS Coronavirus 2: POSITIVE — AB

## 2020-07-19 ENCOUNTER — Telehealth: Payer: Self-pay | Admitting: Nurse Practitioner

## 2020-07-19 ENCOUNTER — Telehealth (HOSPITAL_COMMUNITY): Payer: Self-pay | Admitting: Pharmacist

## 2020-07-19 ENCOUNTER — Other Ambulatory Visit: Payer: Self-pay | Admitting: Nurse Practitioner

## 2020-07-19 ENCOUNTER — Encounter: Payer: Self-pay | Admitting: Nurse Practitioner

## 2020-07-19 DIAGNOSIS — U071 COVID-19: Secondary | ICD-10-CM

## 2020-07-19 DIAGNOSIS — I10 Essential (primary) hypertension: Secondary | ICD-10-CM

## 2020-07-19 DIAGNOSIS — J3089 Other allergic rhinitis: Secondary | ICD-10-CM

## 2020-07-19 MED ORDER — MOLNUPIRAVIR EUA 200MG CAPSULE: 4.0000 | ORAL_CAPSULE | Freq: Two times a day (BID) | ORAL | 0 refills | Status: AC

## 2020-07-19 MED ORDER — FLUTICASONE PROPIONATE 50 MCG/ACT NA SUSP: 2.0000 | Freq: Every day | NASAL | 0 refills | Status: DC

## 2020-07-19 MED FILL — MOLNUPIRAVIR 200 MG CAPS: 200 | 5 days supply | Qty: 40 | Fill #0

## 2020-07-19 MED FILL — FLUTICASONE PROP 50 MCG SPR: 50 | 30 days supply | Qty: 16 | Fill #0

## 2020-07-19 NOTE — Telephone Encounter (Signed)
Outpatient Oral COVID Treatment Note  I connected with Autumn Gomez on 07/19/2020/5:26 PM by telephone and verified that I am speaking with the correct person using two identifiers.  I discussed the limitations, risks, security, and privacy concerns of performing an evaluation and management service by telephone and the availability of in person appointments. I also discussed with the patient that there may be a patient responsible charge related to this service. The patient expressed understanding and agreed to proceed.  Patient location: Home Provider location: Virtual via phone   Diagnosis: COVID-19 infection  Purpose of visit: Discussion of potential use of Molnupiravir or Paxlovid, a new treatment for mild to moderate COVID-19 viral infection in non-hospitalized patients.   Subjective: Patient is a 55 y.o. female who has been diagnosed with COVID 19 viral infection.  Their symptoms began on 07/15/2020 with cough, nasal congestion, sore throat.    Past Medical History:  Diagnosis Date  . Abnormal Pap smear    repeat pap  . Anemia    as child   . Hyperlipidemia   . Hypertension     Allergies  Allergen Reactions  . Keflex [Cephalexin]     Severe rash     Current Outpatient Medications:  .  amLODipine (NORVASC) 10 MG tablet, TAKE 1 TABLET (10 MG TOTAL) BY MOUTH DAILY., Disp: 90 tablet, Rfl: 0 .  cyclobenzaprine (FLEXERIL) 10 MG tablet, Take 1 tablet (10 mg total) by mouth at bedtime., Disp: 30 tablet, Rfl: 0 .  fluticasone (FLONASE) 50 MCG/ACT nasal spray, PLACE 2 SPRAYS INTO BOTH NOSTRILS DAILY., Disp: 16 g, Rfl: 2 .  fluticasone (FLONASE) 50 MCG/ACT nasal spray, Place 2 sprays into both nostrils daily., Disp: 1 g, Rfl: 0 .  molnupiravir EUA 200 mg CAPS, Take 4 capsules (800 mg total) by mouth 2 (two) times daily for 5 days., Disp: 40 capsule, Rfl: 0  Current Facility-Administered Medications:  .  0.9 %  sodium chloride infusion, 500 mL, Intravenous, Once, [place],  MD  Objective: Patient appears/sounds congested.  They are in no apparent distress.  Breathing is non labored.  Mood and behavior are normal.  Laboratory Data:  Recent Results (from the past 2160 hour(s))  Hepatitis C RNA quantitative (QUEST)     Status: Abnormal   Collection Time: 05/14/20  4:32 PM  Result Value Ref Range   HCV RNA, PCR, QN 24 (H) IU/mL   HCV Quantitative Log 1.39 (H) log IU/mL    Comment: . Reference Range:                          Not Detected       IU/mL                          Not Detected   Log IU/mL . This test was performed using Real-Time Polymerase Chain Reaction. . Reportable range is 15 IU/mL to 100,000,000 IU/mL (1.18 Log IU/mL to 8.00 Log IU/mL). . For additional information please refer to http://education.questdiagnostics.com/faq/FAQ22v1 (This link is being provided for informational/ educational purposes only.) . The analytical performance characteristics of this assay have been determined by [facility], [place], [place].  The modifications have not been cleared or approved by the FDA.  This assay has been validated pursuant to the CLIA regulations and is used for clinical purposes. .   Hepatitis A Ab, Total     Status: None   Collection Time: 05/14/20  4:32 PM  Result Value Ref Range   Hepatitis A AB,Total NON-REACTIVE NON-REACTI    Comment: . For additional information, please refer to  http://education.questdiagnostics.com/faq/FAQ202  (This link is being provided for informational/ educational purposes only.) .   SARS CORONAVIRUS 2 (TAT 6-24 HRS) Nasopharyngeal Nasopharyngeal Swab     Status: Abnormal   Collection Time: 07/17/20  6:08 PM   Specimen: Nasopharyngeal Swab  Result Value Ref Range   SARS Coronavirus 2 POSITIVE (A) NEGATIVE    Comment: (NOTE) SARS-CoV-2 target nucleic acids are DETECTED.  The SARS-CoV-2 RNA is generally detectable in upper and lower respiratory specimens during the acute  phase of infection. Positive results are indicative of the presence of SARS-CoV-2 RNA. Clinical correlation with patient history and other diagnostic information is  necessary to determine patient infection status. Positive results do not rule out bacterial infection or co-infection with other viruses.  The expected result is Negative.  Fact Sheet for Patients: SugarRoll.be  Fact Sheet for Healthcare Providers: https://www.woods-mathews.com/  This test is not yet approved or cleared by the [place] FDA and  has been authorized for detection and/or diagnosis of SARS-CoV-2 by FDA under an Emergency Use Authorization (EUA). This EUA will remain  in effect (meaning this test can be used) for the duration of the COVID-19 declaration under Section 564(b)(1) of the Act, 21 U. S.C. section 360bbb-3(b)(1), unless the authorization is terminated or revoked sooner.   Performed at [facility], [place] [address], [place],  [address]      Assessment: 55 y.o. female with mild/moderate COVID 19 viral infection diagnosed on  at 07/17/2020 high risk for progression to severe COVID 19.  Plan:  This patient is a 55 y.o. female that meets the following criteria for Emergency Use Authorization of: Molnupiravir  1. Age >18 yr 2. SARS-COV-2 positive test 3. Symptom onset < 5 days 4. Mild-to-moderate COVID disease with high risk for severe progression to hospitalization or death   I have spoken and communicated the following to the patient or parent/caregiver regarding: 1. Molnupiravir is an unapproved drug that is authorized for use under an Print production planner.  2. There are no adequate, approved, available products for the treatment of COVID-19 in adults who have mild-to-moderate COVID-19 and are at high risk for progressing to severe COVID-19, including hospitalization or death. 3. Other therapeutics are currently authorized. For  additional information on all products authorized for treatment or prevention of COVID-19, please see TanEmporium.pl.  4. There are benefits and risks of taking this treatment as outlined in the "Fact Sheet for Patients and Caregivers."  5. "Fact Sheet for Patients and Caregivers" was reviewed with patient. A hard copy will be provided to patient from pharmacy prior to the patient receiving treatment. 6. Patients should continue to self-isolate and use infection control measures (e.g., wear mask, isolate, social distance, avoid sharing personal items, clean and disinfect "high touch" surfaces, and frequent handwashing) according to CDC guidelines.  7. The patient or parent/caregiver has the option to accept or refuse treatment. 8. [place] has established a pregnancy surveillance program. 9. Females of childbearing potential should use a reliable method of contraception correctly and consistently, as applicable, for the duration of treatment and for 4 days after the last dose of Molnupiravir. 52. Males of reproductive potential who are sexually active with females of childbearing potential should use a reliable method of contraception correctly and consistently during treatment and for at least 3 months after the last  dose. 11. Pregnancy status and risk was assessed. Patient verbalized understanding of precautions.   After reviewing above information with the patient, the patient agrees to receive molnupiravir.  Follow up instructions:    . Take prescription BID x 5 days as directed . Reach out to pharmacist for counseling on medication if desired . For concerns regarding further COVID symptoms please follow up with your PCP or urgent care . For urgent or life-threatening issues, seek care at your local emergency department  The patient was provided an opportunity to ask  questions, and all were answered. The patient agreed with the plan and demonstrated an understanding of the instructions.   Script sent to [facility] and opted to pick up RX.  The patient was advised to call their PCP or seek an in-person evaluation if the symptoms worsen or if the condition fails to improve as anticipated.   I provided 35 minutes of non face-to-face telephone visit time during this encounter, and > 50% was spent counseling as documented under my assessment & plan.  Jobe Gibbon, NP 07/19/2020 /5:26 PM

## 2020-07-19 NOTE — Telephone Encounter (Signed)
Pt called back to report that she has "covid, and that she really needs this Rx"

## 2020-07-19 NOTE — Telephone Encounter (Signed)
Patient was prescribed oral covid treatment molnupiravir and treatment note was reviewed. Medication has been received by Autumn Gomez and reviewed for appropriateness.  Drug Interactions or Dosage Adjustments Noted: none  Delivery Method: pickup  Patient contacted for counseling on telephone and verbalized understanding.   Delivery or Pick-Up Date: 07/19/20   Marye Round 07/19/2020, 5:38 PM [facility] Outpatient Pharmacist Phone# [phone]

## 2020-07-30 ENCOUNTER — Other Ambulatory Visit: Payer: Self-pay | Admitting: Nurse Practitioner

## 2020-07-30 DIAGNOSIS — I10 Essential (primary) hypertension: Secondary | ICD-10-CM

## 2020-07-30 DIAGNOSIS — E785 Hyperlipidemia, unspecified: Secondary | ICD-10-CM

## 2020-07-30 NOTE — Telephone Encounter (Signed)
Requested medication (s) are due for refill today: Yes  Requested medication (s) are on the active medication list: Yes  Last refill:  01/2020  Future visit scheduled: No  Notes to clinic:  Unable to refill per protocol, discontinued by another provider, unsure if PCP wants to renew     Requested Prescriptions  Pending Prescriptions Disp Refills   atorvastatin (LIPITOR) 20 MG tablet [Pharmacy Med Name: ATORVASTATIN CALCIUM 20 MG 20 Tablet] 90 tablet 0    Sig: Take 1 tablet (20 mg total) by mouth daily.      Cardiovascular:  Antilipid - Statins Failed - 07/30/2020  3:09 PM      Failed - Total Cholesterol in normal range and within 360 days    Cholesterol, Total  Date Value Ref Range Status  08/02/2019 222 (H) 100 - 199 mg/dL Final          Failed - LDL in normal range and within 360 days    LDL Chol Calc (NIH)  Date Value Ref Range Status  08/02/2019 120 (H) 0 - 99 mg/dL Final          Failed - HDL in normal range and within 360 days    HDL  Date Value Ref Range Status  08/02/2019 75 >39 mg/dL Final          Failed - Triglycerides in normal range and within 360 days    Triglycerides  Date Value Ref Range Status  08/02/2019 158 (H) 0 - 149 mg/dL Final          Passed - Patient is not pregnant      Passed - Valid encounter within last 12 months    Recent Outpatient Visits           6 months ago Encounter for Papanicolaou smear for cervical cancer screening   [place], Vernia Buff, NP   1 year ago Abnormal uterine bleeding   [place], Vernia Buff, NP   1 year ago Encounter for annual physical exam   [place], Vernia Buff, NP   1 year ago Acute pain of left shoulder   [place], Charlane Ferretti, MD   2 years ago Essential hypertension   [place], [place] W, NP                   amLODipine (NORVASC) 10 MG tablet [Pharmacy Med Name: AMLODIPINE BESYLATE 10 MG T 10 Tablet] 90 tablet 0    Sig: TAKE 1 TABLET (10 MG TOTAL) BY MOUTH DAILY.      Cardiovascular:  Calcium Channel Blockers Failed - 07/30/2020  3:09 PM      Failed - Last BP in normal range    BP Readings from Last 1 Encounters:  07/17/20 (!) 161/102          Failed - Valid encounter within last 6 months    Recent Outpatient Visits           6 months ago Encounter for Papanicolaou smear for cervical cancer screening   [place], Vernia Buff, NP   1 year ago Abnormal uterine bleeding   [place], Vernia Buff, NP   1 year ago Encounter for annual physical exam   [place], Vernia Buff, NP   1 year  ago Acute pain of left shoulder   [place], [place], MD   2 years ago Essential hypertension   [place], Vernia Buff, NP

## 2020-08-01 ENCOUNTER — Telehealth: Payer: Self-pay | Admitting: Nurse Practitioner

## 2020-08-01 NOTE — Telephone Encounter (Signed)
Patient called in asking to have refills on atorvastatin. Please advise.

## 2020-08-02 NOTE — Telephone Encounter (Signed)
Needs OV and labs.  Left message on voicemail to return call.

## 2020-08-03 ENCOUNTER — Other Ambulatory Visit: Payer: Self-pay | Admitting: Family Medicine

## 2020-08-03 DIAGNOSIS — I10 Essential (primary) hypertension: Secondary | ICD-10-CM

## 2020-08-05 NOTE — Telephone Encounter (Signed)
Pt has an appt on 09-18-2020 and is on wait list

## 2020-09-03 ENCOUNTER — Other Ambulatory Visit: Payer: Self-pay | Admitting: Family Medicine

## 2020-09-03 DIAGNOSIS — I10 Essential (primary) hypertension: Secondary | ICD-10-CM

## 2020-09-03 NOTE — Telephone Encounter (Signed)
Requested medication (s) are due for refill today: -  Requested medication (s) are on the active medication list: expired 08/30/20  Last refill:  07/31/20  Future visit scheduled: yes  Notes to clinic:  med has expired   Requested Prescriptions  Pending Prescriptions Disp Refills   amLODipine (NORVASC) 10 MG tablet [Pharmacy Med Name: AMLODIPINE BESYLATE 10MG  TABLETS] 30 tablet 0    Sig: TAKE 1 TABLET BY MOUTH EVERY DAY. NEED APPOINTMENT FOR FUTURE FILLS      Cardiovascular:  Calcium Channel Blockers Failed - 09/03/2020 10:29 AM      Failed - Last BP in normal range    BP Readings from Last 1 Encounters:  07/17/20 (!) 161/102          Failed - Valid encounter within last 6 months    Recent Outpatient Visits           7 months ago Encounter for Papanicolaou smear for cervical cancer screening   [place], Vernia Buff, NP   1 year ago Abnormal uterine bleeding   [place], Vernia Buff, NP   1 year ago Encounter for annual physical exam   [place], Vernia Buff, NP   1 year ago Acute pain of left shoulder   [place], MD   2 years ago Essential hypertension   [place], Zelda W, NP       Future Appointments             In 2 weeks Gildardo Pounds, NP [place]

## 2020-09-06 ENCOUNTER — Other Ambulatory Visit: Payer: Self-pay | Admitting: Family Medicine

## 2020-09-06 DIAGNOSIS — I10 Essential (primary) hypertension: Secondary | ICD-10-CM

## 2020-09-18 ENCOUNTER — Ambulatory Visit: Payer: 59 | Admitting: Nurse Practitioner

## 2020-09-25 ENCOUNTER — Ambulatory Visit (INDEPENDENT_AMBULATORY_CARE_PROVIDER_SITE_OTHER): Payer: 59 | Admitting: Infectious Diseases

## 2020-09-25 ENCOUNTER — Encounter: Payer: Self-pay | Admitting: Infectious Diseases

## 2020-09-25 ENCOUNTER — Other Ambulatory Visit: Payer: Self-pay

## 2020-09-25 VITALS — BP 134/82 | HR 76 | Temp 98.0°F | Wt 209.0 lb

## 2020-09-25 DIAGNOSIS — B182 Chronic viral hepatitis C: Secondary | ICD-10-CM | POA: Diagnosis not present

## 2020-09-25 NOTE — Progress Notes (Signed)
[facility] for Infectious Diseases                                      [address], [place], [place], [address]                                               Phn. [phone]; Fax: [phone]                                                               Date: 09/25/2020  Reason for Follow Up: Hepatitis C end of treatment   HPI: Autumn Gomez is a 55 y.o.old female with a PMH of HCV who is here for follow up in the setting of completion of HCV treatment.   She was started on Mavyret on 04/03/20 and was planned to complete a course of 8 weeks which she completed on 05/29/20. Denies missing any doses of Mavyret. No complaints today. Feels well.  She also received 1st dose of Hep B Vaccine and is due for Hep B # 2 today. She is also non immune to Hep A.  Discussed about re-infection with Hep C. She thinks she could have an infected sexual partner that transferred her Hep C. She was asking if she could start her atorvastatin now that she has completed HCV treatment which is reasonable from my end.   Smokes marijuana, denies alcohol and no other drugs. Denies being sexually active lately.  09/25/20 Here for follow of for SVR check after completion of HCV tx. No new complaints. MRI abdomen was not able to be performed as insurance declined. She is doing well and working on her new job Discussed about chances of re-infection with risk factors and also avoiding alcohol and hepatotoxic medications   ROS: Denies yellowish discoloration of sclera and skin, abdominal pain/distension, hematemesis.            Dcough, fever, chills, nightsweats, nausea, vomiting, diarrhea, constipation, weight loss, recent hospitalizations, rashes, joint complaints, shortness of breath, chest pain, headaches, dysuria .  Current Outpatient Medications on File Prior to Visit  Medication Sig Dispense Refill  . amLODipine (NORVASC) 10 MG tablet TAKE 1 TABLET BY MOUTH  EVERY DAY. NEED APPOINTMENT FOR FUTURE FILLS 30 tablet 0  . cyclobenzaprine (FLEXERIL) 10 MG tablet Take 1 tablet (10 mg total) by mouth at bedtime. 30 tablet 0  . fluticasone (FLONASE) 50 MCG/ACT nasal spray PLACE 2 SPRAYS INTO BOTH NOSTRILS DAILY. 16 g 2  . Molnupiravir 200 MG CAPS TAKE 4 CAPSULES BY MOUTH 2 TIMES DAILY FOR 5 DAYS 40 capsule 0  . [DISCONTINUED] atorvastatin (LIPITOR) 20 MG tablet Take 1 tablet (20 mg total) by mouth daily. (Patient not taking: Reported on 06/26/2020) 90 tablet 0   Current Facility-Administered Medications on File Prior to Visit  Medication Dose Route Frequency Provider Last Rate Last Admin  . 0.9 %  sodium chloride infusion  500 mL Intravenous Once Thornton Park, MD         Allergies  Allergen Reactions  . Keflex [Cephalexin]  Severe rash    Past Medical History:  Diagnosis Date  . Abnormal Pap smear    repeat pap  . Anemia    as child   . Hyperlipidemia   . Hypertension    Past Surgical History:  Procedure Laterality Date  . BREAST EXCISIONAL BIOPSY Left   . 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 left breast cyst  Left 1986   Social History   Socioeconomic History  . Marital status: Single    Spouse name: Not on file  . Number of children: 0  . Years of education: college   . Highest education level: Not on file  Occupational History  . Occupation: Early Childhood education    Employer: [employer]    Comment: part time, loader, manual work   Tobacco Use  . Smoking status: Former Research scientist (life sciences)  . Smokeless tobacco: Never Used  . Tobacco comment: smokes black and milds on the weekends   Vaping Use  . Vaping Use: Never used  Substance and Sexual Activity  . Alcohol use: Yes    Comment: socially   . Drug use: Yes    Types: Marijuana    Comment: occasional use  . Sexual activity: Not Currently    Birth control/protection: None  Other Topics Concern  . Not on file  Social History Narrative   Lives alone   2 god babies     Mom in [place], [place]. , mom 56 in 2016    Social Determinants of Health   Financial Resource Strain: Not on file  Food Insecurity: Not on file  Transportation Needs: Not on file  Physical Activity: Not on file  Stress: Not on file  Social Connections: Not on file  Intimate Partner Violence: Not on file    Vitals  Wt 209 lb (94.8 kg)   BMI 34.78 kg/m   Gen: Alert and oriented x 3, no acute distress HEENT: [place]/AT, PERL, no scleral icterus, no pale conjunctivae, hearing normal, oral mucosa moist Neck: Supple, no lymphadenopathy Cardio: Regular rate and rhythm; +S1 and S2; no murmurs, gallops, or rubs Resp: CTAB; no wheezes, rhonchi, or rales GI: Soft, nontender, nondistended, bowel sounds present GU: Musc: Extremities: No cyanosis, clubbing, or edema; +2 PT and DP pulses Skin: no rashes  Neuro: No focal deficits Psych: Calm, cooperative  Laboratory  CBC Latest Ref Rng & Units 02/29/2020 08/02/2019 01/04/2019  WBC 3.8 - 10.8 Thousand/uL 8.0 7.0 6.2  Hemoglobin 11.7 - 15.5 g/dL 12.0 11.3 10.4(L)  Hematocrit 35.0 - 45.0 % 35.4 33.2(L) 34.1  Platelets 140 - 400 Thousand/uL 314 272 348   CMP Latest Ref Rng & Units 02/29/2020 01/17/2020 08/02/2019  Glucose 65 - 99 mg/dL - 92 97  BUN 6 - 24 mg/dL - 13 13  Creatinine 0.57 - 1.00 mg/dL - 0.91 0.90  Sodium 134 - 144 mmol/L - 139 143  Potassium 3.5 - 5.2 mmol/L - 4.1 5.2  Chloride 96 - 106 mmol/L - 104 106  CO2 20 - 29 mmol/L - 22 24  Calcium 8.7 - 10.2 mg/dL - 9.1 9.5  Total Protein 6.0 - 8.5 g/dL - 6.8 6.9  Total Bilirubin 0.0 - 1.2 mg/dL - 0.3 0.2  Alkaline Phos 48 - 121 IU/L - 73 78  AST 0 - 40 IU/L - 32 28  ALT 6 - 29 U/L 20 23 23     Assessment/Plan: # Chronic Hepatitis C, Genotype 1 a, F0 - S/p completion of 8 weeks of  Mavyret on 05/29/20 - HCV RNA for SVR  - I will call her insurance regarding peer to peer for her MRI abdomen  # Immunization  -Will make a nursing visit for Hepatitis A #2  Counseling done on the  following -Transmission (avoid sharing personal hygiene equipment) -Avoid hepatotoxins like alcohol and excessive acetamaminphen (no more than 2 gram a day) -Avoid eating raw sea food -Risks of re-infection  -Hepatitis coinfection and vaccination  I spent greater than 25 minutes with the patient including review of prior medical records with greater than 50% of time in face to face counsel of the patient.   Patient's labs were reviewed as well as his previous records. Patients questions were addressed and answered.   Electronically signed by:  Rosiland Oz, MD Infectious Diseases  Office phone [phone] Fax no. [phone]

## 2020-09-28 LAB — HEPATITIS C RNA QUANTITATIVE
HCV Quantitative Log: <1.18 log IU/mL
HCV RNA, PCR, QN: <15 IU/mL

## 2020-10-02 ENCOUNTER — Other Ambulatory Visit: Payer: Self-pay

## 2020-10-02 ENCOUNTER — Ambulatory Visit (HOSPITAL_COMMUNITY)
Admission: RE | Admit: 2020-10-02 | Discharge: 2020-10-02 | Disposition: A | Discharge status: Home / Self Care | Payer: 59 | Source: Ambulatory Visit | Attending: Infectious Diseases | Admitting: Infectious Diseases

## 2020-10-02 DIAGNOSIS — K769 Liver disease, unspecified: Secondary | ICD-10-CM | POA: Diagnosis present

## 2020-10-02 MED ORDER — GADOBUTROL 1 MMOL/ML IV SOLN
9.0000 mL | Freq: Once | INTRAVENOUS | Status: AC | PRN
  Administered 2020-10-02: 9 mL via INTRAVENOUS

## 2020-10-04 ENCOUNTER — Encounter: Payer: Self-pay | Admitting: Nurse Practitioner

## 2020-10-07 ENCOUNTER — Other Ambulatory Visit: Payer: Self-pay | Admitting: Nurse Practitioner

## 2020-10-07 DIAGNOSIS — I10 Essential (primary) hypertension: Secondary | ICD-10-CM

## 2020-10-07 MED ORDER — AMLODIPINE BESYLATE 10 MG PO TABS: ORAL_TABLET | ORAL | 0 refills | Status: DC

## 2020-10-14 ENCOUNTER — Ambulatory Visit: Payer: 59 | Attending: Nurse Practitioner | Admitting: Nurse Practitioner

## 2020-10-14 ENCOUNTER — Encounter: Payer: Self-pay | Admitting: Nurse Practitioner

## 2020-10-14 ENCOUNTER — Other Ambulatory Visit: Payer: Self-pay

## 2020-10-14 VITALS — BP 133/82 | HR 73 | Ht 65.0 in | Wt 211.0 lb

## 2020-10-14 DIAGNOSIS — E785 Hyperlipidemia, unspecified: Secondary | ICD-10-CM | POA: Diagnosis not present

## 2020-10-14 DIAGNOSIS — D649 Anemia, unspecified: Secondary | ICD-10-CM | POA: Diagnosis not present

## 2020-10-14 DIAGNOSIS — M791 Myalgia, unspecified site: Secondary | ICD-10-CM

## 2020-10-14 DIAGNOSIS — I10 Essential (primary) hypertension: Secondary | ICD-10-CM | POA: Diagnosis not present

## 2020-10-14 DIAGNOSIS — Z23 Encounter for immunization: Secondary | ICD-10-CM

## 2020-10-14 DIAGNOSIS — J309 Allergic rhinitis, unspecified: Secondary | ICD-10-CM

## 2020-10-14 MED ORDER — ATORVASTATIN CALCIUM 20 MG PO TABS
20.0000 mg | ORAL_TABLET | Freq: Every day | ORAL | 1 refills | Status: DC
  Filled 2020-10-14: qty 90, 90d supply, fill #0
  Filled 2021-01-08: qty 90, 90d supply, fill #1

## 2020-10-14 MED ORDER — AMLODIPINE BESYLATE 10 MG PO TABS
10.0000 mg | ORAL_TABLET | Freq: Every day | ORAL | 0 refills | Status: DC
  Filled 2020-10-14: qty 90, 90d supply, fill #0

## 2020-10-14 MED ORDER — CYCLOBENZAPRINE HCL 10 MG PO TABS
10.0000 mg | ORAL_TABLET | Freq: Every day | ORAL | 1 refills | Status: DC
  Filled 2020-10-14: qty 30, 30d supply, fill #0

## 2020-10-14 MED ORDER — FLUTICASONE PROPIONATE 50 MCG/ACT NA SUSP
2.0000 | Freq: Every day | NASAL | 2 refills | Status: DC
  Filled 2020-10-14: qty 16, 30d supply, fill #0

## 2020-10-14 NOTE — Progress Notes (Signed)
Pt needs refills on medication

## 2020-10-14 NOTE — Progress Notes (Signed)
Assessment & Plan:  [name] was seen today for medication refill.  Diagnoses and all orders for this visit:  Essential hypertension -     CMP14+EGFR -     amLODipine (NORVASC) 10 MG tablet; Take 1 tablet (10 mg total) by mouth daily. Continue all antihypertensives as prescribed.  Remember to bring in your blood pressure log with you for your follow up appointment.  DASH/Mediterranean Diets are healthier choices for HTN.    Dyslipidemia, goal LDL below 100 -     Lipid panel -     atorvastatin (LIPITOR) 20 MG tablet; Take 1 tablet (20 mg total) by mouth daily. -     Hepatic Function Panel; Future INSTRUCTIONS: Work on a low fat, heart healthy diet and participate in regular aerobic exercise program by working out at least 150 minutes per week; 5 days a week-30 minutes per day. Avoid red meat/beef/steak,  fried foods. junk foods, sodas, sugary drinks, unhealthy snacking, alcohol and smoking.  Drink at least 80 oz of water per day and monitor your carbohydrate intake daily.    Mild anemia -     CBC  Myalgia -     cyclobenzaprine (FLEXERIL) 10 MG tablet; Take 1 tablet (10 mg total) by mouth at bedtime.  Allergic rhinitis, unspecified seasonality, unspecified trigger -     fluticasone (FLONASE) 50 MCG/ACT nasal spray; Place 2 sprays into both nostrils daily.  Encounter for immunization -     fluticasone (FLONASE) 50 MCG/ACT nasal spray; Place 2 sprays into both nostrils daily.    Patient has been counseled on age-appropriate routine health concerns for screening and prevention. These are reviewed and up-to-date. Referrals have been placed accordingly. Immunizations are up-to-date or declined.    Subjective:   Chief Complaint  Patient presents with  . Medication Refill   HPI Autumn Gomez 55 y.o. female presents to office today for follow up.  has a past medical history of Abnormal Pap smear, Anemia, Hyperlipidemia, and Hypertension She is being followed by ID for Chronic  hep C.  She was started on Mavyret on 04/03/20 and completed on 05/29/20  The 10-year ASCVD risk score Mikey Bussing DC Brooke Bonito., et al., 2013) is: 4.2%   Values used to calculate the score:     Age: 30 years     Sex: Female     Is Non-Hispanic African American: Yes     Diabetic: No     Tobacco smoker: No     Systolic Blood Pressure: 250 mmHg     Is BP treated: Yes     HDL Cholesterol: 75 mg/dL     Total Cholesterol: 222 mg/dL   Essential Hypertension  Well controlled. She is taking amlodipine 10 g daily as prescribed. Denies chest pain, shortness of breath, palpitations, lightheadedness, dizziness, headaches or BLE edema.   BP Readings from Last 3 Encounters:  10/14/20 133/82  09/25/20 134/82  07/17/20 (!) 161/102   Review of Systems  Constitutional: Negative for fever, malaise/fatigue and weight loss.  HENT: Negative.  Negative for nosebleeds.   Eyes: Negative.  Negative for blurred vision, double vision and photophobia.  Respiratory: Negative.  Negative for cough and shortness of breath.   Cardiovascular: Negative.  Negative for chest pain, palpitations and leg swelling.  Gastrointestinal: Negative.  Negative for heartburn, nausea and vomiting.  Musculoskeletal: Positive for myalgias.  Neurological: Negative.  Negative for dizziness, focal weakness, seizures and headaches.  Endo/Heme/Allergies: Positive for environmental allergies.  Psychiatric/Behavioral: Negative.  Negative for suicidal ideas.  Past Medical History:  Diagnosis Date  . Abnormal Pap smear    repeat pap  . Anemia    as child   . Hyperlipidemia   . Hypertension     Past Surgical History:  Procedure Laterality Date  . BREAST EXCISIONAL BIOPSY Left   . 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 left breast cyst  Left 1986    Family History  Problem Relation Age of Onset  . Cancer Father        lung  . Hypertension Father   . Hypertension Mother   . Breast cancer Maternal Aunt   . Breast cancer Cousin    . Colon cancer Neg Hx   . Esophageal cancer Neg Hx   . Stomach cancer Neg Hx   . Rectal cancer Neg Hx     Social History Reviewed with no changes to be made today.   Outpatient Medications Prior to Visit  Medication Sig Dispense Refill  . Molnupiravir 200 MG CAPS TAKE 4 CAPSULES BY MOUTH 2 TIMES DAILY FOR 5 DAYS 40 capsule 0  . amLODipine (NORVASC) 10 MG tablet TAKE 1 TABLET BY MOUTH EVERY DAY. NEED APPOINTMENT FOR FUTURE FILLS 30 tablet 0  . cyclobenzaprine (FLEXERIL) 10 MG tablet Take 1 tablet (10 mg total) by mouth at bedtime. 30 tablet 0  . fluticasone (FLONASE) 50 MCG/ACT nasal spray PLACE 2 SPRAYS INTO BOTH NOSTRILS DAILY. 16 g 2   Facility-Administered Medications Prior to Visit  Medication Dose Route Frequency Provider Last Rate Last Admin  . 0.9 %  sodium chloride infusion  500 mL Intravenous Once Thornton Park, MD        Allergies  Allergen Reactions  . Keflex [Cephalexin]     Severe rash       Objective:    BP 133/82   Pulse 73   Ht 5' 5"  (1.651 m)   Wt 211 lb (95.7 kg)   SpO2 100%   BMI 35.11 kg/m  Wt Readings from Last 3 Encounters:  10/14/20 211 lb (95.7 kg)  09/25/20 209 lb (94.8 kg)  06/26/20 213 lb (96.6 kg)    Physical Exam Vitals and nursing note reviewed.  Constitutional:      Appearance: She is well-developed.  HENT:     Head: Normocephalic and atraumatic.  Cardiovascular:     Rate and Rhythm: Normal rate and regular rhythm.     Heart sounds: Normal heart sounds. No murmur heard. No friction rub. No gallop.   Pulmonary:     Effort: Pulmonary effort is normal. No tachypnea or respiratory distress.     Breath sounds: Normal breath sounds. No decreased breath sounds, wheezing, rhonchi or rales.  Chest:     Chest wall: No tenderness.  Abdominal:     General: Bowel sounds are normal.     Palpations: Abdomen is soft.  Musculoskeletal:        General: Normal range of motion.     Cervical back: Normal range of motion.  Skin:     General: Skin is warm and dry.  Neurological:     Mental Status: She is alert and oriented to person, place, and time.     Coordination: Coordination normal.  Psychiatric:        Behavior: Behavior normal. Behavior is cooperative.        Thought Content: Thought content normal.        Judgment: Judgment normal.  Patient has been counseled extensively about nutrition and exercise as well as the importance of adherence with medications and regular follow-up. The patient was given clear instructions to go to ER or return to medical center if symptoms don't improve, worsen or new problems develop. The patient verbalized understanding.   Follow-up: Return in about 3 months (around 01/13/2021) for 6 weeks labs only. See me in 3 months.   Gildardo Pounds, FNP-BC [facility] [place], [place]   10/14/2020, 10:20 PM

## 2020-10-15 ENCOUNTER — Other Ambulatory Visit: Payer: Self-pay

## 2020-10-15 LAB — CMP14+EGFR
ALT: 15 IU/L (ref 0–32)
AST: 23 IU/L (ref 0–40)
Albumin/Globulin Ratio: 1.3 (ref 1.2–2.2)
Albumin: 4.1 g/dL (ref 3.8–4.9)
Alkaline Phosphatase: 73 IU/L (ref 44–121)
BUN/Creatinine Ratio: 16 (ref 9–23)
BUN: 16 mg/dL (ref 6–24)
Bilirubin Total: <0.2 mg/dL (ref 0.0–1.2)
CO2: 17 mmol/L — ABNORMAL LOW (ref 20–29)
Calcium: 9.3 mg/dL (ref 8.7–10.2)
Chloride: 102 mmol/L (ref 96–106)
Creatinine, Ser: 1.03 mg/dL — ABNORMAL HIGH (ref 0.57–1.00)
Globulin, Total: 3.2 g/dL (ref 1.5–4.5)
Glucose: 102 mg/dL — ABNORMAL HIGH (ref 65–99)
Potassium: 4.3 mmol/L (ref 3.5–5.2)
Sodium: 140 mmol/L (ref 134–144)
Total Protein: 7.3 g/dL (ref 6.0–8.5)
eGFR: 65 mL/min/{1.73_m2} (ref >59)

## 2020-10-15 LAB — LIPID PANEL
Chol/HDL Ratio: 4.9 ratio — ABNORMAL HIGH (ref 0.0–4.4)
Cholesterol, Total: 270 mg/dL — ABNORMAL HIGH (ref 100–199)
HDL: 55 mg/dL (ref >39)
LDL Chol Calc (NIH): 174 mg/dL — ABNORMAL HIGH (ref 0–99)
Triglycerides: 220 mg/dL — ABNORMAL HIGH (ref 0–149)
VLDL Cholesterol Cal: 41 mg/dL — ABNORMAL HIGH (ref 5–40)

## 2020-10-15 LAB — CBC
Hematocrit: 35.8 % (ref 34.0–46.6)
Hemoglobin: 11.7 g/dL (ref 11.1–15.9)
MCH: 29.9 pg (ref 26.6–33.0)
MCHC: 32.7 g/dL (ref 31.5–35.7)
MCV: 92 fL (ref 79–97)
Platelets: 253 10*3/uL (ref 150–450)
RBC: 3.91 x10E6/uL (ref 3.77–5.28)
RDW: 12.9 % (ref 11.7–15.4)
WBC: 7.6 10*3/uL (ref 3.4–10.8)

## 2020-11-25 ENCOUNTER — Ambulatory Visit: Payer: 59 | Attending: Nurse Practitioner

## 2020-11-25 ENCOUNTER — Other Ambulatory Visit: Payer: Self-pay

## 2020-11-25 DIAGNOSIS — E785 Hyperlipidemia, unspecified: Secondary | ICD-10-CM

## 2020-11-26 LAB — HEPATIC FUNCTION PANEL
ALT: 13 IU/L (ref 0–32)
AST: 20 IU/L (ref 0–40)
Albumin: 4 g/dL (ref 3.8–4.9)
Alkaline Phosphatase: 85 IU/L (ref 44–121)
Bilirubin Total: <0.2 mg/dL (ref 0.0–1.2)
Bilirubin, Direct: <0.1 mg/dL (ref 0.00–0.40)
Total Protein: 6.7 g/dL (ref 6.0–8.5)

## 2020-12-25 ENCOUNTER — Encounter: Payer: Self-pay | Admitting: Infectious Diseases

## 2020-12-25 ENCOUNTER — Other Ambulatory Visit: Payer: Self-pay

## 2020-12-25 ENCOUNTER — Ambulatory Visit (INDEPENDENT_AMBULATORY_CARE_PROVIDER_SITE_OTHER): Payer: 59 | Admitting: Infectious Diseases

## 2020-12-25 VITALS — BP 132/83 | HR 61 | Temp 97.8°F | Wt 212.0 lb

## 2020-12-25 DIAGNOSIS — B1710 Acute hepatitis C without hepatic coma: Secondary | ICD-10-CM | POA: Diagnosis not present

## 2020-12-25 DIAGNOSIS — Z23 Encounter for immunization: Secondary | ICD-10-CM

## 2020-12-25 NOTE — Progress Notes (Signed)
[facility] for Infectious Diseases                                      [address], [place], [place], [address]                                               Phn. [phone]; Fax: [phone]                                                               Date: 7/72022  Reason for Follow Up: Hepatitis C end of treatment   HPI: Autumn Gomez is a 55 y.o.old female with a PMH of HCV who is here for follow up in the setting of completion of HCV treatment.   She was started on Mavyret on 04/03/20 and was planned to complete a course of 8 weeks which she completed on 05/29/20. Denies missing any doses of Mavyret. No complaints today. Feels well.  She also received 1st dose of Hep B Vaccine and is due for Hep B # 2 today. She is also non immune to Hep A.  Discussed about re-infection with Hep C. She thinks she could have an infected sexual partner that transferred her Hep C. She was asking if she could start her atorvastatin now that she has completed HCV treatment which is reasonable from my end.   Smokes marijuana, denies alcohol and no other drugs. Denies being sexually active lately.  09/25/20 Here for follow of for SVR check after completion of HCV tx. No new complaints. MRI abdomen was not able to be performed as insurance declined. She is doing well and working on her new job Discussed about chances of re-infection with risk factors and also avoiding alcohol and hepatotoxic medications   12/25/20 Doing well. She has completed Hep C treatment and had achieved SVR. Explained to her that she has been cured from Hepatitis C. Continues to smoke marijuana, denies alcohol and illict drug use. She has been vaccinated for COVID with 2 doses and booster. She knows about the risk factors of Hepatitis C infection and chances of re-infection. She is willing to get second dose of Hepatitis A vaccine today. No other complaints.   ROS: Denies yellowish  discoloration of sclera and skin, abdominal pain/distension, hematemesis.            Dcough, fever, chills, nightsweats, nausea, vomiting, diarrhea, constipation, weight loss, recent hospitalizations, rashes, joint complaints, shortness of breath, chest pain, headaches, dysuria .   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    left breast cyst  Left 1986   Social History   Socioeconomic History   Marital status:  Single    Spouse name: Not on file   Number of children: 0   Years of education: college    Highest education level: Not on file  Occupational History   Occupation: Early Childhood education    Employer: [employer]    Comment: part time, loader, manual work   Tobacco Use   Smoking status: Former    Pack years: 0.00   Smokeless tobacco: Never   Tobacco comments:    smokes black and milds on the weekends   Vaping Use   Vaping Use: Never used  Substance and Sexual Activity   Alcohol use: Yes    Comment: socially    Drug use: Yes    Types: Marijuana    Comment: occasional use   Sexual activity: Not Currently    Birth control/protection: None  Other Topics Concern   Not on file  Social History Narrative   Lives alone   2 god babies    Mom in [place], [place]. , mom 61 in 2016    Social Determinants of Health   Financial Resource Strain: Not on file  Food Insecurity: Not on file  Transportation Needs: Not on file  Physical Activity: Not on file  Stress: Not on file  Social Connections: Not on file  Intimate Partner Violence: Not on file    Vitals  BP 132/83   Pulse 61   Temp 97.8 F (36.6 C) (Oral)   Wt 212 lb (96.2 kg)   BMI 35.28 kg/m    Gen: Alert and oriented x  3, no acute distress HEENT: [place]/AT, no scleral icterus, no pale conjunctivae, hearing normal, oral mucosa moist Neck: Supple, no lymphadenopathy Cardio: Regular rate and rhythm; +S1 and S2; no murmurs, gallops, or rubs Resp: CTAB; no wheezes, rhonchi, or rales GI: Soft, nontender, nondistended, bowel sounds present GU: Musc: Extremities: No cyanosis, clubbing, or edema; +2 PT and DP pulses Skin: no rashes  Neuro: No focal deficits Psych: Calm, cooperative  Laboratory  CBC Latest Ref Rng & Units 10/14/2020 02/29/2020 08/02/2019  WBC 3.4 - 10.8 x10E3/uL 7.6 8.0 7.0  Hemoglobin 11.1 - 15.9 g/dL 11.7 12.0 11.3  Hematocrit 34.0 - 46.6 % 35.8 35.4 33.2(L)  Platelets 150 - 450 x10E3/uL 253 314 272   CMP Latest Ref Rng & Units 11/25/2020 10/14/2020 02/29/2020  Glucose 65 - 99 mg/dL - 102(H) -  BUN 6 - 24 mg/dL - 16 -  Creatinine 0.57 - 1.00 mg/dL - 1.03(H) -  Sodium 134 - 144 mmol/L - 140 -  Potassium 3.5 - 5.2 mmol/L - 4.3 -  Chloride 96 - 106 mmol/L - 102 -  CO2 20 - 29 mmol/L - 17(L) -  Calcium 8.7 - 10.2 mg/dL - 9.3 -  Total Protein 6.0 - 8.5 g/dL 6.7 7.3 -  Total Bilirubin 0.0 - 1.2 mg/dL <0.2 <0.2 -  Alkaline Phos 44 - 121 IU/L 85 73 -  AST 0 - 40 IU/L 20 23 -  ALT 0 - 32 IU/L 13 15 20     Assessment/Plan: # Chronic Hepatitis C, Genotype 1 a, F0 - S/p completion of 8 weeks of Mavyret on 05/29/20 - HCV RNA for SVR on 09/25/20 negative - cured  - Discussed MRI abdome  findings - Fu as needed   # Immunization  - hepatitis A #2  Counseling done on the following -Transmission (avoid sharing personal hygiene equipment) -Avoid hepatotoxins like alcohol and excessive acetamaminphen (no more than 2 gram a day) -Avoid eating raw sea  food -Risks of re-infection  -Hepatitis coinfection and vaccination  I spent greater than 35 minutes with the patient including review of prior medical records with greater than 50% of time in face to face counsel of the patient.   Patient's labs were  reviewed as well as his previous records. Patients questions were addressed and answered.   Electronically signed by:  Rosiland Oz, MD Infectious Diseases  Office phone [phone] Fax no. [phone]

## 2021-01-08 ENCOUNTER — Other Ambulatory Visit: Payer: Self-pay

## 2021-01-08 ENCOUNTER — Other Ambulatory Visit: Payer: Self-pay | Admitting: Nurse Practitioner

## 2021-01-08 DIAGNOSIS — I10 Essential (primary) hypertension: Secondary | ICD-10-CM

## 2021-01-08 MED ORDER — AMLODIPINE BESYLATE 10 MG PO TABS
10.0000 mg | ORAL_TABLET | Freq: Every day | ORAL | 0 refills | Status: DC
  Filled 2021-01-08: qty 90, 90d supply, fill #0

## 2021-01-13 ENCOUNTER — Other Ambulatory Visit: Payer: Self-pay

## 2021-01-13 ENCOUNTER — Ambulatory Visit: Payer: 59 | Attending: Nurse Practitioner | Admitting: Nurse Practitioner

## 2021-01-13 ENCOUNTER — Encounter: Payer: Self-pay | Admitting: Nurse Practitioner

## 2021-01-13 VITALS — BP 143/88 | HR 76 | Resp 16 | Wt 210.6 lb

## 2021-01-13 DIAGNOSIS — E785 Hyperlipidemia, unspecified: Secondary | ICD-10-CM | POA: Diagnosis not present

## 2021-01-13 DIAGNOSIS — I10 Essential (primary) hypertension: Secondary | ICD-10-CM

## 2021-01-13 DIAGNOSIS — Z1231 Encounter for screening mammogram for malignant neoplasm of breast: Secondary | ICD-10-CM

## 2021-01-13 MED ORDER — ATORVASTATIN CALCIUM 20 MG PO TABS: 20.0000 mg | ORAL_TABLET | Freq: Every day | ORAL | 1 refills | Status: DC

## 2021-01-13 NOTE — Progress Notes (Signed)
Assessment & Plan:  Autumn Gomez was seen today for hypertension.  Diagnoses and all orders for this visit:  Primary hypertension  Breast cancer screening by mammogram -     MM DIGITAL SCREENING BILATERAL; Future  Dyslipidemia, goal LDL below 100 -     atorvastatin (LIPITOR) 20 MG tablet; Take 1 tablet (20 mg total) by mouth daily.   Patient has been counseled on age-appropriate routine health concerns for screening and prevention. These are reviewed and up-to-date. Referrals have been placed accordingly. Immunizations are up-to-date or declined.    Subjective:   Chief Complaint  Patient presents with   Hypertension   HPI Autumn Gomez 55 y.o. female presents to office today for follow up. She has a past medical history of Abnormal Pap smear, Anemia, Hyperlipidemia, and Hypertension.   HTN Not well controlled however she has been out of her amlodipine 10 mg daily for quire some time. Denies chest pain, shortness of breath, palpitations, lightheadedness, dizziness, headaches or BLE edema.   She does not monitor her blood pressure at home.  BP Readings from Last 3 Encounters:  01/13/21 (!) 143/88  12/25/20 132/83  10/14/20 133/82     HPL Cholesterol levels not at goal with atorvastatin 20 mg daily.  Lab Results  Component Value Date   CHOL 270 (H) 10/14/2020   CHOL 222 (H) 08/02/2019   CHOL 242 (H) 01/04/2019   Lab Results  Component Value Date   HDL 55 10/14/2020   HDL 75 08/02/2019   HDL 79 01/04/2019   Lab Results  Component Value Date   LDLCALC 174 (H) 10/14/2020   LDLCALC 120 (H) 08/02/2019   LDLCALC 139 (H) 01/04/2019   Lab Results  Component Value Date   TRIG 220 (H) 10/14/2020   TRIG 158 (H) 08/02/2019   TRIG 119 01/04/2019   Lab Results  Component Value Date   CHOLHDL 4.9 (H) 10/14/2020   CHOLHDL 3.0 08/02/2019   CHOLHDL 3.1 01/04/2019    Review of Systems  Constitutional:  Negative for fever, malaise/fatigue and weight loss.  HENT: Negative.   Negative for nosebleeds.   Eyes: Negative.  Negative for blurred vision, double vision and photophobia.  Respiratory: Negative.  Negative for cough and shortness of breath.   Cardiovascular: Negative.  Negative for chest pain, palpitations and leg swelling.  Gastrointestinal: Negative.  Negative for heartburn, nausea and vomiting.  Musculoskeletal: Negative.  Negative for myalgias.  Neurological: Negative.  Negative for dizziness, focal weakness, seizures and headaches.  Psychiatric/Behavioral: Negative.  Negative for suicidal ideas.    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   Outpatient Medications Prior  to Visit  Medication Sig Dispense Refill   amLODipine (NORVASC) 10 MG tablet Take 1 tablet (10 mg total) by mouth daily. 90 tablet 0   cyclobenzaprine (FLEXERIL) 10 MG tablet Take 1 tablet (10 mg total) by mouth at bedtime. 30 tablet 1   fluticasone (FLONASE) 50 MCG/ACT nasal spray Place 2 sprays into both nostrils daily. 16 g 2   Molnupiravir 200 MG CAPS TAKE 4 CAPSULES BY MOUTH 2 TIMES DAILY FOR 5 DAYS (Patient not taking: Reported on 01/13/2021) 40 capsule 0   atorvastatin (LIPITOR) 20 MG tablet Take 1 tablet (20 mg total) by mouth daily. 90 tablet 1   Facility-Administered Medications Prior to Visit  Medication Dose Route Frequency Provider Last Rate Last Admin   0.9 %  sodium  chloride infusion  500 mL Intravenous Once Thornton Park, MD        Allergies  Allergen Reactions   Keflex [Cephalexin]     Severe rash       Objective:    BP (!) 143/88   Pulse 76   Resp 16   Wt 210 lb 9.6 oz (95.5 kg)   SpO2 100%   BMI 35.05 kg/m  Wt Readings from Last 3 Encounters:  01/13/21 210 lb 9.6 oz (95.5 kg)  12/25/20 212 lb (96.2 kg)  10/14/20 211 lb (95.7 kg)    Physical Exam Vitals and nursing note reviewed.  Constitutional:      Appearance: She is well-developed.  HENT:     Head: Normocephalic and atraumatic.  Cardiovascular:     Rate and Rhythm: Normal rate and regular rhythm.     Heart sounds: Normal heart sounds. No murmur heard.   No friction rub. No gallop.  Pulmonary:     Effort: Pulmonary effort is normal. No tachypnea or respiratory distress.     Breath sounds: Normal breath sounds. No decreased breath sounds, wheezing, rhonchi or rales.  Chest:     Chest wall: No tenderness.  Abdominal:     General: Bowel sounds are normal.     Palpations: Abdomen is soft.  Musculoskeletal:        General: Normal range of motion.     Cervical back: Normal range of motion.  Skin:    General: Skin is warm and dry.  Neurological:     Mental Status: She is alert and oriented to person, place, and time.     Coordination: Coordination normal.  Psychiatric:        Behavior: Behavior normal. Behavior is cooperative.        Thought Content: Thought content normal.        Judgment: Judgment normal.         Patient has been counseled extensively about nutrition and exercise as well as the importance of adherence with medications and regular follow-up. The patient was given clear instructions to go to ER or return to medical center if symptoms don't improve, worsen or new problems develop. The patient verbalized understanding.   Follow-up: No follow-ups on file.   Gildardo Pounds, FNP-BC [facility] and [facility] [place],  [place]   01/16/2021, 10:45 PM

## 2021-01-16 ENCOUNTER — Encounter: Payer: Self-pay | Admitting: Nurse Practitioner

## 2021-01-20 ENCOUNTER — Other Ambulatory Visit: Payer: Self-pay

## 2021-01-20 ENCOUNTER — Telehealth: Payer: Self-pay | Admitting: Nurse Practitioner

## 2021-01-20 NOTE — Telephone Encounter (Signed)
Pt wants PCP to know she was exposed to covid, took a test and turned out positive. Pt wants to know what else she can do as far as Treatment  and how to obtain a note for work for being out for a few days. Please advise and thank you

## 2021-01-20 NOTE — Telephone Encounter (Signed)
She can schedule a tele visit 810 or 130.

## 2021-01-20 NOTE — Telephone Encounter (Signed)
Will route to PCP for review. 

## 2021-01-20 NOTE — Telephone Encounter (Signed)
Pt wants PCP to know she was exposed to covid, took a test and turned out positive. Pt wants to know what else she

## 2021-01-21 NOTE — Telephone Encounter (Signed)
Pt has been set an appointment for 01/22/2021 at 11:10

## 2021-01-22 ENCOUNTER — Other Ambulatory Visit: Payer: Self-pay

## 2021-01-22 ENCOUNTER — Encounter: Payer: Self-pay | Admitting: Nurse Practitioner

## 2021-01-22 ENCOUNTER — Ambulatory Visit: Payer: 59 | Attending: Nurse Practitioner | Admitting: Nurse Practitioner

## 2021-01-22 DIAGNOSIS — U071 COVID-19: Secondary | ICD-10-CM

## 2021-01-22 MED ORDER — MOLNUPIRAVIR 200 MG PO CAPS
800.0000 mg | ORAL_CAPSULE | Freq: Two times a day (BID) | ORAL | 0 refills | Status: AC
  Filled 2021-01-22: qty 40, 5d supply, fill #0

## 2021-01-22 MED ORDER — BENZONATATE 100 MG PO CAPS
100.0000 mg | ORAL_CAPSULE | Freq: Two times a day (BID) | ORAL | 0 refills | Status: DC | PRN
  Filled 2021-01-22: qty 20, 10d supply, fill #0

## 2021-01-22 MED ORDER — FLUTICASONE PROPIONATE 50 MCG/ACT NA SUSP
2.0000 | Freq: Every day | NASAL | 2 refills | Status: DC
Start: 2021-01-22 — End: 2021-12-24
  Filled 2021-01-22 – 2021-07-17 (×3): qty 16, 30d supply, fill #0
  Filled 2021-08-18: qty 16, 30d supply, fill #1

## 2021-01-22 NOTE — Progress Notes (Signed)
Virtual Visit via Telephone Note Due to national recommendations of social distancing due to [place] 19, telehealth visit is felt to be most appropriate for this patient at this time.  I discussed the limitations, risks, security and privacy concerns of performing an evaluation and management service by telephone and the availability of in person appointments. I also discussed with the patient that there may be a patient responsible charge related to this service. The patient expressed understanding and agreed to proceed.    I connected with Autumn Gomez on 01/22/21  at  11:10 AM EDT  EDT by telephone and verified that I am speaking with the correct person using two identifiers.  Location of Patient: Private Residence   Location of Provider: [place] [facility] [place] participating in Telemedicine visit: Geryl Rankins FNP-BC [facility]    History of Present Illness: Telemedicine visit for: COVID  Diagnosed with COVID on Monday August 1st. She is currently taking nyquil and dayquil for symptoms [id number] throat, productive cough, sinus drainage. Denies fever, headache, N/V/D.  She was also diagnosed with COVID in January and treated with molnupiravir.    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    left breast cyst  Left 1986    Family History  Problem Relation Age of Onset   Cancer Father        lung   Hypertension Father    Hypertension Mother    Breast cancer Maternal Aunt    Breast cancer Cousin    Colon cancer Neg Hx    Esophageal cancer Neg Hx    Stomach cancer Neg Hx    Rectal cancer Neg Hx     Social History   Socioeconomic History   Marital status: Single    Spouse name: Not on file   Number of children: 0    Years of education: college    Highest education level: Not on file  Occupational History   Occupation: Early Childhood education    Employer: [employer]    Comment: part time, loader, manual work   Tobacco Use   Smoking status: Former   Smokeless tobacco: Never   Tobacco comments:    smokes black and milds on the weekends   Vaping Use   Vaping Use: Never used  Substance and Sexual Activity   Alcohol use: Yes    Comment: socially    Drug use: Yes    Types: Marijuana    Comment: occasional use   Sexual activity: Not Currently    Birth control/protection: None  Other Topics Concern   Not on file  Social History Narrative   Lives alone   2 god babies    Mom in [place], [place]. , mom 54 in 2016    Social Determinants of Health   Financial Resource Strain: Not on file  Food Insecurity: Not on file  Transportation Needs: Not on file  Physical Activity: Not on file  Stress: Not on file  Social Connections: Not on file     Observations/Objective: Awake, alert and oriented x 3   Review of Systems  Constitutional:  Positive for malaise/fatigue. Negative for chills and fever.  HENT:  Positive for congestion, sinus pain and sore throat. Negative for ear discharge, ear pain and hearing loss.   Eyes: Negative.   Respiratory:  Positive for cough. Negative for sputum production, shortness of breath and wheezing.   Cardiovascular: Negative.  Negative for chest pain, orthopnea and leg swelling.  Gastrointestinal: Negative.  Negative for abdominal pain, diarrhea, nausea and vomiting.  Neurological:  Negative for dizziness, focal weakness and headaches.  Endo/Heme/Allergies:  Negative for environmental allergies.  Psychiatric/Behavioral: Negative.     Assessment and Plan: Diagnoses and all orders for this visit:  COVID -     Molnupiravir 200 MG CAPS; Take 4 capsules (800 mg total) by mouth 2 (two) times daily for 5 days. -     benzonatate (TESSALON) 100 MG capsule; Take 1 capsule (100  mg total) by mouth 2 (two) times daily as needed for cough. -     fluticasone (FLONASE) 50 MCG/ACT nasal spray; Place 2 sprays into both nostrils daily.    Follow Up Instructions Return if symptoms worsen or fail to improve.     I discussed the assessment and treatment plan with the patient. The patient was provided an opportunity to ask questions and all were answered. The patient agreed with the plan and demonstrated an understanding of the instructions.   The patient was advised to call back or seek an in-person evaluation if the symptoms worsen or if the condition fails to improve as anticipated.  I provided 10 minutes of non-face-to-face time during this encounter including median intraservice time, reviewing previous notes, labs, imaging, medications and explaining diagnosis and management.  Gildardo Pounds, FNP-BC

## 2021-03-18 ENCOUNTER — Other Ambulatory Visit: Payer: Self-pay

## 2021-03-18 ENCOUNTER — Ambulatory Visit
Admission: RE | Admit: 2021-03-18 | Discharge: 2021-03-18 | Disposition: A | Discharge status: Home / Self Care | Payer: 59 | Source: Ambulatory Visit | Attending: Nurse Practitioner | Admitting: Nurse Practitioner

## 2021-03-18 DIAGNOSIS — Z1231 Encounter for screening mammogram for malignant neoplasm of breast: Secondary | ICD-10-CM

## 2021-04-15 ENCOUNTER — Ambulatory Visit: Payer: 59 | Attending: Nurse Practitioner | Admitting: Nurse Practitioner

## 2021-04-15 ENCOUNTER — Other Ambulatory Visit: Payer: Self-pay

## 2021-04-15 ENCOUNTER — Telehealth: Payer: Self-pay | Admitting: Nurse Practitioner

## 2021-04-15 ENCOUNTER — Encounter: Payer: Self-pay | Admitting: Nurse Practitioner

## 2021-04-15 DIAGNOSIS — E785 Hyperlipidemia, unspecified: Secondary | ICD-10-CM

## 2021-04-15 DIAGNOSIS — I10 Essential (primary) hypertension: Secondary | ICD-10-CM

## 2021-04-15 MED ORDER — ATORVASTATIN CALCIUM 40 MG PO TABS
40.0000 mg | ORAL_TABLET | Freq: Every day | ORAL | 1 refills | Status: DC
  Filled 2021-04-15: qty 90, 90d supply, fill #0
  Filled 2021-07-17: qty 30, 30d supply, fill #0
  Filled 2021-07-17: qty 90, 90d supply, fill #1
  Filled 2021-08-18: qty 30, 30d supply, fill #1
  Filled 2021-09-18: qty 30, 30d supply, fill #2

## 2021-04-15 MED ORDER — AMLODIPINE BESYLATE 10 MG PO TABS
10.0000 mg | ORAL_TABLET | Freq: Every day | ORAL | 1 refills | Status: DC
Start: 2021-04-15 — End: 2021-10-21
  Filled 2021-04-15: qty 90, 90d supply, fill #0
  Filled 2021-07-17: qty 90, 90d supply, fill #1
  Filled 2021-07-17: qty 30, 30d supply, fill #0
  Filled 2021-08-18: qty 30, 30d supply, fill #1
  Filled 2021-09-18: qty 30, 30d supply, fill #2

## 2021-04-15 MED ORDER — ATORVASTATIN CALCIUM 20 MG PO TABS
20.0000 mg | ORAL_TABLET | Freq: Every day | ORAL | 1 refills | Status: DC
  Filled 2021-04-15: qty 90, 90d supply, fill #0

## 2021-04-15 NOTE — Progress Notes (Signed)
Virtual Visit Note Due to national recommendations of social distancing due to [place] 19, virtual visit is felt to be most appropriate for this patient at this time.  I discussed the limitations, risks, security and privacy concerns of performing an evaluation and management service by video and the availability of in person appointments. I also discussed with the patient that there may be a patient responsible charge related to this service. The patient expressed understanding and agreed to proceed.    I connected with Autumn Gomez on 04/15/21  at   3:50 PM EDT  EDT by VIDEO and verified that I am speaking with the correct person using two identifiers.   Location of Patient: Private Residence   Location of Provider: [place] [facility] [employer] Office    Persons participating in VIRTUAL visit: Geryl Rankins FNP-BC [place]    History of Present Illness: VIRTUAL visit for: HTN She has a past medical history of Abnormal Pap smear, Anemia, Hyperlipidemia, and Hypertension.   HTN Reports recent readings 120/70s. She is taking amlodipine 10 mg daily as prescribed.  BP Readings from Last 3 Encounters:  01/13/21 (!) 143/88  12/25/20 132/83  10/14/20 133/82    Dyslipidemia Lipids not at goal. Will increase atorvastatin to 40 mg daily.  The 10-year ASCVD risk score (Arnett DK, et al., 2019) is: 8.9%   Values used to calculate the score:     Age: 55 years     Sex: Female     Is Non-Hispanic African American: Yes     Diabetic: No     Tobacco smoker: No     Systolic Blood Pressure: 846 mmHg     Is BP treated: Yes     HDL Cholesterol: 55 mg/dL     Total Cholesterol: 270 mg/dL  Lab Results  Component Value Date   CHOL 270 (H) 10/14/2020   CHOL 222 (H) 08/02/2019   CHOL 242 (H) 01/04/2019   Lab Results  Component Value Date   HDL 55 10/14/2020   HDL 75 08/02/2019   HDL 79 01/04/2019   Lab Results  Component Value Date   LDLCALC 174 (H) 10/14/2020    LDLCALC 120 (H) 08/02/2019   LDLCALC 139 (H) 01/04/2019   Lab Results  Component Value Date   TRIG 220 (H) 10/14/2020   TRIG 158 (H) 08/02/2019   TRIG 119 01/04/2019   Lab Results  Component Value Date   CHOLHDL 4.9 (H) 10/14/2020   CHOLHDL 3.0 08/02/2019   CHOLHDL 3.1 01/04/2019     Past Medical History:  Diagnosis Date  . Abnormal Pap smear    repeat pap  . Anemia    as child   . Hyperlipidemia   . Hypertension     Past Surgical History:  Procedure Laterality Date  . BREAST EXCISIONAL BIOPSY Left   . 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 left breast cyst  Left 1986    Family History  Problem Relation Age of Onset  . Cancer Father        lung  . Hypertension Father   . Hypertension Mother   . Breast cancer Maternal Aunt   . Breast cancer Cousin   . Colon cancer Neg Hx   . Esophageal cancer Neg Hx   . Stomach cancer Neg Hx   . Rectal cancer Neg Hx     Social History   Socioeconomic History  . Marital status: Single    Spouse name: Not  on file  . Number of children: 0  . Years of education: college   . Highest education level: Not on file  Occupational History  . Occupation: Early Childhood education    Employer: [employer]    Comment: part time, loader, manual work   Tobacco Use  . Smoking status: Former  . Smokeless tobacco: Never  . Tobacco comments:    smokes black and milds on the weekends   Vaping Use  . Vaping Use: Never used  Substance and Sexual Activity  . Alcohol use: Yes    Comment: socially   . Drug use: Yes    Types: Marijuana    Comment: occasional use  . Sexual activity: Not Currently    Birth control/protection: None  Other Topics Concern  . Not on file  Social History Narrative   Lives alone   2 god babies    Mom in [place], [place]. , mom 70 in 2016    Social Determinants of Health   Financial Resource Strain: Not on file  Food Insecurity: Not on file  Transportation Needs: Not on file  Physical Activity: Not on file   Stress: Not on file  Social Connections: Not on file     Observations/Objective: Awake, alert and oriented x 3   Review of Systems  Constitutional:  Negative for fever, malaise/fatigue and weight loss.  HENT: Negative.  Negative for nosebleeds.   Eyes: Negative.  Negative for blurred vision, double vision and photophobia.  Respiratory: Negative.  Negative for cough and shortness of breath.   Cardiovascular: Negative.  Negative for chest pain, palpitations and leg swelling.  Gastrointestinal: Negative.  Negative for heartburn, nausea and vomiting.  Musculoskeletal:  Positive for joint pain (knee pain which is currently resolved). Negative for myalgias.  Neurological: Negative.  Negative for dizziness, focal weakness, seizures and headaches.  Psychiatric/Behavioral: Negative.  Negative for suicidal ideas.    Assessment and Plan: Diagnoses and all orders for this visit:  Primary hypertension -     amLODipine (NORVASC) 10 MG tablet; Take 1 tablet (10 mg total) by mouth daily.   Dyslipidemia, goal LDL below 100 -     atorvastatin (LIPITOR) 20 MG tablet; Take 1 tablet (20 mg total) by mouth daily. Increased atorvastatin to 40 mg today    Follow Up Instructions Return in about 3 months (around 07/16/2021).     I discussed the assessment and treatment plan with the patient. The patient was provided an opportunity to ask questions and all were answered. The patient agreed with the plan and demonstrated an understanding of the instructions.   The patient was advised to call back or seek an in-person evaluation if the symptoms worsen or if the condition fails to improve as anticipated.  I provided 13 minutes of face-to-face time during this encounter including median intraservice time, reviewing previous notes, labs, imaging, medications and explaining diagnosis and management.  Gildardo Pounds, FNP-BC

## 2021-04-15 NOTE — Telephone Encounter (Signed)
No answer LVM @ 3:33 No answer LVM @ 3.52

## 2021-04-17 ENCOUNTER — Other Ambulatory Visit: Payer: Self-pay

## 2021-06-25 ENCOUNTER — Ambulatory Visit: Payer: Self-pay | Attending: Nurse Practitioner | Admitting: Nurse Practitioner

## 2021-06-25 ENCOUNTER — Other Ambulatory Visit: Payer: Self-pay | Admitting: Nurse Practitioner

## 2021-06-25 ENCOUNTER — Other Ambulatory Visit: Payer: Self-pay

## 2021-06-25 ENCOUNTER — Encounter: Payer: Self-pay | Admitting: Nurse Practitioner

## 2021-06-25 VITALS — BP 145/88 | HR 76 | Ht 65.0 in | Wt 216.1 lb

## 2021-06-25 DIAGNOSIS — I10 Essential (primary) hypertension: Secondary | ICD-10-CM

## 2021-06-25 DIAGNOSIS — Z23 Encounter for immunization: Secondary | ICD-10-CM

## 2021-06-25 DIAGNOSIS — R202 Paresthesia of skin: Secondary | ICD-10-CM

## 2021-06-25 NOTE — Progress Notes (Signed)
Assessment & Plan:  Autumn Gomez was seen today for hypertension.  Diagnoses and all orders for this visit:  Primary hypertension Continue all antihypertensives as prescribed.  Remember to bring in your blood pressure log with you for your follow up appointment.  DASH/Mediterranean Diets are healthier choices for HTN.    Need for influenza vaccination -     Flu Vaccine QUAD 60mo+IM (Fluarix, Fluzone & Alfiuria Quad PF)  Paresthesia of left leg Discussed need for weight management  Patient has been counseled on age-appropriate routine health concerns for screening and prevention. These are reviewed and up-to-date. Referrals have been placed accordingly. Immunizations are up-to-date or declined.    Subjective:   Chief Complaint  Patient presents with   Hypertension  Autumn Gomez is being seen today for follow up to HTN. She has a past medical history of Abnormal Pap smear, Anemia, Hyperlipidemia, and Hypertension.   HTN Taking amlodipine 10 mg daily.  Blood pressure not well controlled. We discussed dietary and weight modifications. Will need to likely switch to valsartan if BP still not at goal next office visit.  BP Readings from Last 3 Encounters:  06/25/21 (!) 145/88  01/13/21 (!) 143/88  12/25/20 132/83    Paresthesia Notes transient paraesthesia of left thigh. Does not radiate and reports severity as mild. Denies any cervical or lumbar pain. Paresthesia does not limit daily activity.   Review of Systems  Constitutional:  Negative for fever, malaise/fatigue and weight loss.  HENT: Negative.  Negative for nosebleeds.   Eyes: Negative.  Negative for blurred vision, double vision and photophobia.  Respiratory: Negative.  Negative for cough and shortness of breath.   Cardiovascular: Negative.  Negative for chest pain, palpitations and leg swelling.  Gastrointestinal: Negative.  Negative for heartburn, nausea and vomiting.  Musculoskeletal: Negative.  Negative for myalgias.   Neurological:  Positive for sensory change. Negative for dizziness, focal weakness, seizures and headaches.  Psychiatric/Behavioral: Negative.  Negative for suicidal ideas.    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   Outpatient Medications Prior to Visit  Medication Sig Dispense Refill   amLODipine (NORVASC) 10 MG tablet Take 1 tablet (10 mg total) by mouth daily. 90 tablet 1   atorvastatin (LIPITOR) 40 MG tablet Take 1 tablet (40 mg total) by mouth daily. 90 tablet 1   benzonatate (TESSALON) 100 MG capsule Take 1 capsule (100 mg total) by mouth 2 (two) times daily as needed for cough. 20 capsule 0   cyclobenzaprine (FLEXERIL) 10 MG tablet Take 1 tablet (10 mg total) by mouth at bedtime. 30 tablet 1   fluticasone (FLONASE) 50 MCG/ACT nasal  spray Place 2 sprays into both nostrils daily. 16 g 2   Facility-Administered Medications Prior to Visit  Medication Dose Route Frequency Provider Last Rate Last Admin   0.9 %  sodium chloride infusion  500 mL Intravenous Once Thornton Park, MD        Allergies  Allergen Reactions   Keflex [Cephalexin]     Severe rash       Objective:    BP (!) 145/88    Pulse 76    Ht 5\' 5"  (1.651 m)    Wt 216 lb 2 oz (98 kg)    SpO2 100%    BMI 35.97 kg/m  Wt Readings from Last 3 Encounters:  06/25/21 216 lb 2 oz (98 kg)  01/13/21 210 lb 9.6 oz (95.5  kg)  12/25/20 212 lb (96.2 kg)    Physical Exam Vitals and nursing note reviewed.  Constitutional:      Appearance: She is well-developed.  HENT:     Head: Normocephalic and atraumatic.  Cardiovascular:     Rate and Rhythm: Normal rate and regular rhythm.     Heart sounds: Normal heart sounds. No murmur heard.   No friction rub. No gallop.  Pulmonary:     Effort: Pulmonary effort is normal. No tachypnea or respiratory distress.     Breath sounds: Normal breath sounds. No decreased breath sounds, wheezing, rhonchi or rales.  Chest:     Chest wall: No tenderness.  Abdominal:     General: Bowel sounds are normal.     Palpations: Abdomen is soft.  Musculoskeletal:        General: Normal range of motion.     Cervical back: Normal range of motion.  Skin:    General: Skin is warm and dry.  Neurological:     Mental Status: She is alert and oriented to person, place, and time.     Coordination: Coordination normal.  Psychiatric:        Behavior: Behavior normal. Behavior is cooperative.        Thought Content: Thought content normal.        Judgment: Judgment normal.         Patient has been counseled extensively about nutrition and exercise as well as the importance of adherence with medications and regular follow-up. The patient was given clear instructions to go to ER or return to medical center if symptoms don't improve, worsen or new problems develop. The patient verbalized understanding.   Follow-up: Return in about 3 months (around 09/23/2021) for HTN.   Gildardo Pounds, FNP-BC [facility] and [facility] [place], [place]   06/26/2021, 11:48 AM

## 2021-06-26 ENCOUNTER — Encounter: Payer: Self-pay | Admitting: Nurse Practitioner

## 2021-06-26 LAB — CMP14+EGFR
ALT: 12 IU/L (ref 0–32)
AST: 24 IU/L (ref 0–40)
Albumin/Globulin Ratio: 1.8 (ref 1.2–2.2)
Albumin: 4.4 g/dL (ref 3.8–4.9)
Alkaline Phosphatase: 92 IU/L (ref 44–121)
BUN/Creatinine Ratio: 17 (ref 9–23)
BUN: 17 mg/dL (ref 6–24)
Bilirubin Total: <0.2 mg/dL (ref 0.0–1.2)
CO2: 24 mmol/L (ref 20–29)
Calcium: 9.4 mg/dL (ref 8.7–10.2)
Chloride: 103 mmol/L (ref 96–106)
Creatinine, Ser: 1.01 mg/dL — ABNORMAL HIGH (ref 0.57–1.00)
Globulin, Total: 2.5 g/dL (ref 1.5–4.5)
Glucose: 78 mg/dL (ref 70–99)
Potassium: 4.3 mmol/L (ref 3.5–5.2)
Sodium: 141 mmol/L (ref 134–144)
Total Protein: 6.9 g/dL (ref 6.0–8.5)
eGFR: 66 mL/min/{1.73_m2} (ref >59)

## 2021-06-30 ENCOUNTER — Other Ambulatory Visit: Payer: Self-pay

## 2021-07-04 ENCOUNTER — Other Ambulatory Visit: Payer: Self-pay

## 2021-07-17 ENCOUNTER — Other Ambulatory Visit: Payer: Self-pay

## 2021-07-18 ENCOUNTER — Other Ambulatory Visit: Payer: Self-pay

## 2021-08-19 ENCOUNTER — Other Ambulatory Visit: Payer: Self-pay

## 2021-09-18 ENCOUNTER — Other Ambulatory Visit: Payer: Self-pay

## 2021-09-22 ENCOUNTER — Other Ambulatory Visit: Payer: Self-pay

## 2021-10-21 ENCOUNTER — Other Ambulatory Visit: Payer: Self-pay | Admitting: Nurse Practitioner

## 2021-10-21 DIAGNOSIS — E785 Hyperlipidemia, unspecified: Secondary | ICD-10-CM

## 2021-10-21 DIAGNOSIS — I10 Essential (primary) hypertension: Secondary | ICD-10-CM

## 2021-10-22 ENCOUNTER — Other Ambulatory Visit: Payer: Self-pay

## 2021-10-22 MED ORDER — AMLODIPINE BESYLATE 10 MG PO TABS
10.0000 mg | ORAL_TABLET | Freq: Every day | ORAL | 0 refills | Status: DC
  Filled 2021-10-22: qty 30, 30d supply, fill #0

## 2021-10-22 MED ORDER — ATORVASTATIN CALCIUM 40 MG PO TABS
40.0000 mg | ORAL_TABLET | Freq: Every day | ORAL | 0 refills | Status: DC
  Filled 2021-10-22: qty 30, 30d supply, fill #0

## 2021-11-21 ENCOUNTER — Other Ambulatory Visit: Payer: Self-pay | Admitting: Family Medicine

## 2021-11-21 ENCOUNTER — Other Ambulatory Visit: Payer: Self-pay

## 2021-11-21 DIAGNOSIS — E785 Hyperlipidemia, unspecified: Secondary | ICD-10-CM

## 2021-11-21 DIAGNOSIS — I10 Essential (primary) hypertension: Secondary | ICD-10-CM

## 2021-11-24 ENCOUNTER — Other Ambulatory Visit: Payer: Self-pay

## 2021-11-25 ENCOUNTER — Other Ambulatory Visit: Payer: Self-pay | Admitting: Family Medicine

## 2021-11-25 ENCOUNTER — Other Ambulatory Visit: Payer: Self-pay

## 2021-11-25 DIAGNOSIS — I10 Essential (primary) hypertension: Secondary | ICD-10-CM

## 2021-11-25 DIAGNOSIS — E785 Hyperlipidemia, unspecified: Secondary | ICD-10-CM

## 2021-12-24 ENCOUNTER — Encounter: Payer: Self-pay | Admitting: Physician Assistant

## 2021-12-24 ENCOUNTER — Ambulatory Visit: Payer: 59 | Attending: Physician Assistant | Admitting: Physician Assistant

## 2021-12-24 ENCOUNTER — Other Ambulatory Visit: Payer: Self-pay

## 2021-12-24 VITALS — BP 138/84 | HR 65 | Wt 218.8 lb

## 2021-12-24 DIAGNOSIS — L237 Allergic contact dermatitis due to plants, except food: Secondary | ICD-10-CM

## 2021-12-24 DIAGNOSIS — E785 Hyperlipidemia, unspecified: Secondary | ICD-10-CM

## 2021-12-24 DIAGNOSIS — J309 Allergic rhinitis, unspecified: Secondary | ICD-10-CM | POA: Diagnosis not present

## 2021-12-24 DIAGNOSIS — I10 Essential (primary) hypertension: Secondary | ICD-10-CM | POA: Diagnosis not present

## 2021-12-24 DIAGNOSIS — Z131 Encounter for screening for diabetes mellitus: Secondary | ICD-10-CM

## 2021-12-24 MED ORDER — FLUTICASONE PROPIONATE 50 MCG/ACT NA SUSP
2.0000 | Freq: Every day | NASAL | 2 refills | Status: DC
  Filled 2021-12-24: qty 16, 30d supply, fill #0
  Filled 2022-03-23: qty 16, 30d supply, fill #1
  Filled 2022-04-21: qty 16, 30d supply, fill #2

## 2021-12-24 MED ORDER — AMLODIPINE BESYLATE 10 MG PO TABS
10.0000 mg | ORAL_TABLET | Freq: Every day | ORAL | 1 refills | Status: DC
  Filled 2021-12-24: qty 90, 90d supply, fill #0
  Filled 2022-03-23: qty 30, 30d supply, fill #1
  Filled 2022-04-21: qty 30, 30d supply, fill #2
  Filled 2022-05-25: qty 30, 30d supply, fill #3

## 2021-12-24 MED ORDER — TRIAMCINOLONE ACETONIDE 0.1 % EX CREA
1.0000 | TOPICAL_CREAM | Freq: Two times a day (BID) | CUTANEOUS | 0 refills | Status: DC
  Filled 2021-12-24: qty 30, 15d supply, fill #0

## 2021-12-24 MED ORDER — ATORVASTATIN CALCIUM 40 MG PO TABS
40.0000 mg | ORAL_TABLET | Freq: Every day | ORAL | 1 refills | Status: DC
  Filled 2021-12-24: qty 90, 90d supply, fill #0
  Filled 2022-03-23: qty 30, 30d supply, fill #1
  Filled 2022-04-21: qty 30, 30d supply, fill #2
  Filled 2022-05-25: qty 30, 30d supply, fill #3

## 2021-12-24 NOTE — Patient Instructions (Signed)
Drink 80-100 ounces water daily.    Focus your diet around lean proteins and non-starchy vegetables.  [facility] Diet is a reasonable book to read for a reasonable nutrition plan

## 2021-12-24 NOTE — Progress Notes (Signed)
Patient ID: Autumn Gomez, female   DOB: 04/10/1966, 56 y.o.   MRN: [id number]   Autumn Gomez, is a 56 y.o. female  [id number]  [id number]  DOB - 12-06-1965  Chief Complaint  Patient presents with   Rash       Subjective:   Autumn Gomez is a 56 y.o. female here today for diet consult.  Also wants to discuss son bella/fat sculpting.  Out of BP meds.  No HA/dizziness/CP/SOB.    Contacted PI on her R arm about 1 week ago.  Seems to be improving but still itching.    No problems updated.  ALLERGIES: Allergies  Allergen Reactions   Keflex [Cephalexin]     Severe rash    PAST MEDICAL HISTORY: Past Medical History:  Diagnosis Date   Abnormal Pap smear    repeat pap   Anemia    as child    Hyperlipidemia    Hypertension     MEDICATIONS AT HOME: Prior to Admission medications   Medication Sig Start Date End Date Taking? Authorizing Provider  triamcinolone cream (KENALOG) 0.1 % Apply 1 Application topically 2 (two) times daily. 12/24/21  Yes Freeman Caldron M, PA-C  amLODipine (NORVASC) 10 MG tablet Take 1 tablet (10 mg total) by mouth daily. 12/24/21   Argentina Donovan, PA-C  atorvastatin (LIPITOR) 40 MG tablet Take 1 tablet (40 mg total) by mouth daily. 12/24/21   Argentina Donovan, PA-C  benzonatate (TESSALON) 100 MG capsule Take 1 capsule (100 mg total) by mouth 2 (two) times daily as needed for cough. 01/22/21   Gildardo Pounds, NP  cyclobenzaprine (FLEXERIL) 10 MG tablet Take 1 tablet (10 mg total) by mouth at bedtime. 10/14/20   Gildardo Pounds, NP  fluticasone (FLONASE) 50 MCG/ACT nasal spray Place 2 sprays into both nostrils daily. 12/24/21   Janiyha Montufar, Dionne Bucy, PA-C    ROS: Neg HEENT Neg resp Neg cardiac Neg GI Neg GU Neg MS Neg psych Neg neuro  Objective:   Vitals:   12/24/21 1048 12/24/21 1055  BP: (!) 157/91 138/84  Pulse: 65   SpO2: 96%   Weight: 218 lb 12.8 oz (99.2 kg)    Exam General appearance : Awake, alert, not in any distress.  Speech Clear. Not toxic looking HEENT: Atraumatic and Normocephalic Neck: Supple, no JVD. No cervical lymphadenopathy.  Chest: Good air entry bilaterally, CTAB.  No rales/rhonchi/wheezing CVS: S1 S2 regular, no murmurs.  Extremities: B/L Lower Ext shows no edema, both legs are warm to touch Neurology: Awake alert, and oriented X 3, CN II-XII intact, Non focal Skin: contact distribution vesicles and scabbing R forearm-no sign secondary infection  Data Review No results found for: "HGBA1C"  Assessment & Plan   1. Essential hypertension Resume meds.  OOO readings controlled on meds - Hemoglobin A1c - amLODipine (NORVASC) 10 MG tablet; Take 1 tablet (10 mg total) by mouth daily.  Dispense: 90 tablet; Refill: 1 - Comprehensive metabolic panel  2. Poison ivy dermatitis Resolving but can use for itching - triamcinolone cream (KENALOG) 0.1 %; Apply 1 Application topically 2 (two) times daily.  Dispense: 30 g; Refill: 0  3. Dyslipidemia, goal LDL below 100 - Hemoglobin A1c - atorvastatin (LIPITOR) 40 MG tablet; Take 1 tablet (40 mg total) by mouth daily.  Dispense: 90 tablet; Refill: 1 - Comprehensive metabolic panel - Lipid panel  4. Allergic rhinitis, unspecified seasonality, unspecified trigger  5. Screening for diabetes mellitus I have had a lengthy discussion and provided  education about insulin resistance and the intake of too much sugar/refined carbohydrates.  I have advised the patient to work at a goal of eliminating sugary drinks, candy, desserts, sweets, refined sugars, processed foods, and white carbohydrates.  The patient expresses understanding.  Diet counseling. - Hemoglobin A1c    Return in about 6 months (around 06/26/2022) for PCP chronic conditions.  The patient was given clear instructions to go to ER or return to medical center if symptoms don't improve, worsen or new problems develop. The patient verbalized understanding. The patient was told to call to get lab  results if they haven't heard anything in the next week.      Freeman Caldron, PA-C [facility] and [facility] [place], [place]   12/24/2021, 11:42 AM

## 2021-12-25 LAB — COMPREHENSIVE METABOLIC PANEL
ALT: 12 IU/L (ref 0–32)
AST: 22 IU/L (ref 0–40)
Albumin/Globulin Ratio: 1.6 (ref 1.2–2.2)
Albumin: 4.3 g/dL (ref 3.8–4.9)
Alkaline Phosphatase: 90 IU/L (ref 44–121)
BUN/Creatinine Ratio: 14 (ref 9–23)
BUN: 15 mg/dL (ref 6–24)
Bilirubin Total: <0.2 mg/dL (ref 0.0–1.2)
CO2: 21 mmol/L (ref 20–29)
Calcium: 9.4 mg/dL (ref 8.7–10.2)
Chloride: 106 mmol/L (ref 96–106)
Creatinine, Ser: 1.09 mg/dL — ABNORMAL HIGH (ref 0.57–1.00)
Globulin, Total: 2.7 g/dL (ref 1.5–4.5)
Glucose: 100 mg/dL — ABNORMAL HIGH (ref 70–99)
Potassium: 4.6 mmol/L (ref 3.5–5.2)
Sodium: 141 mmol/L (ref 134–144)
Total Protein: 7 g/dL (ref 6.0–8.5)
eGFR: 60 mL/min/{1.73_m2} (ref >59)

## 2021-12-25 LAB — LIPID PANEL
Chol/HDL Ratio: 4.4 ratio (ref 0.0–4.4)
Cholesterol, Total: 220 mg/dL — ABNORMAL HIGH (ref 100–199)
HDL: 50 mg/dL (ref >39)
LDL Chol Calc (NIH): 141 mg/dL — ABNORMAL HIGH (ref 0–99)
Triglycerides: 160 mg/dL — ABNORMAL HIGH (ref 0–149)
VLDL Cholesterol Cal: 29 mg/dL (ref 5–40)

## 2021-12-25 LAB — HEMOGLOBIN A1C
Est. average glucose Bld gHb Est-mCnc: 120 mg/dL
Hgb A1c MFr Bld: 5.8 % — ABNORMAL HIGH (ref 4.8–5.6)

## 2022-03-23 ENCOUNTER — Other Ambulatory Visit: Payer: Self-pay

## 2022-03-25 ENCOUNTER — Other Ambulatory Visit: Payer: Self-pay | Admitting: Nurse Practitioner

## 2022-03-25 ENCOUNTER — Other Ambulatory Visit: Payer: Self-pay

## 2022-03-25 DIAGNOSIS — Z1231 Encounter for screening mammogram for malignant neoplasm of breast: Secondary | ICD-10-CM

## 2022-04-21 ENCOUNTER — Other Ambulatory Visit: Payer: Self-pay

## 2022-04-21 ENCOUNTER — Ambulatory Visit
Admission: RE | Admit: 2022-04-21 | Discharge: 2022-04-21 | Disposition: A | Discharge status: Home / Self Care | Payer: Commercial Managed Care - HMO | Source: Ambulatory Visit

## 2022-04-21 DIAGNOSIS — Z1231 Encounter for screening mammogram for malignant neoplasm of breast: Secondary | ICD-10-CM

## 2022-04-23 ENCOUNTER — Other Ambulatory Visit: Payer: Self-pay

## 2022-05-25 ENCOUNTER — Other Ambulatory Visit: Payer: Self-pay

## 2022-05-25 ENCOUNTER — Other Ambulatory Visit: Payer: Self-pay | Admitting: Physician Assistant

## 2022-05-25 MED ORDER — FLUTICASONE PROPIONATE 50 MCG/ACT NA SUSP
2.0000 | Freq: Every day | NASAL | 0 refills | Status: DC
  Filled 2022-05-25: qty 16, 30d supply, fill #0

## 2022-05-26 ENCOUNTER — Other Ambulatory Visit: Payer: Self-pay

## 2022-06-25 ENCOUNTER — Telehealth: Payer: Self-pay | Admitting: Physician Assistant

## 2022-06-25 DIAGNOSIS — E785 Hyperlipidemia, unspecified: Secondary | ICD-10-CM

## 2022-06-25 DIAGNOSIS — I10 Essential (primary) hypertension: Secondary | ICD-10-CM

## 2022-06-25 MED ORDER — ATORVASTATIN CALCIUM 40 MG PO TABS
40.0000 mg | ORAL_TABLET | Freq: Every day | ORAL | 0 refills | Status: DC
  Filled 2022-06-25: qty 30, 30d supply, fill #0

## 2022-06-25 MED ORDER — AMLODIPINE BESYLATE 10 MG PO TABS
10.0000 mg | ORAL_TABLET | Freq: Every day | ORAL | 0 refills | Status: DC
  Filled 2022-06-25: qty 30, 30d supply, fill #0

## 2022-06-25 NOTE — Telephone Encounter (Signed)
Called pt to make follow up appt. Mail box is full.

## 2022-06-25 NOTE — Telephone Encounter (Signed)
Courtesy refill. Patient will need a follow up appointment for further refills. Requested Prescriptions  Pending Prescriptions Disp Refills   amLODipine (NORVASC) 10 MG tablet 30 tablet 0    Sig: Take 1 tablet (10 mg total) by mouth daily.     Cardiovascular: Calcium Channel Blockers 2 Failed - 06/25/2022  5:00 PM      Failed - Valid encounter within last 6 months    Recent Outpatient Visits           6 months ago Essential hypertension   [place], Dionne Bucy, [place]   1 year ago Primary hypertension   [place], Vernia Buff, NP   1 year ago Primary hypertension   [place], Vernia Buff, NP   1 year ago [place], Vernia Buff, NP   1 year ago Primary hypertension   [place], [place] W, NP              Passed - Last BP in normal range    BP Readings from Last 1 Encounters:  12/24/21 138/84         Passed - Last Heart Rate in normal range    Pulse Readings from Last 1 Encounters:  12/24/21 65          atorvastatin (LIPITOR) 40 MG tablet 30 tablet 0    Sig: Take 1 tablet (40 mg total) by mouth daily.     Cardiovascular:  Antilipid - Statins Failed - 06/25/2022  5:00 PM      Failed - Lipid Panel in normal range within the last 12 months    Cholesterol, Total  Date Value Ref Range Status  12/24/2021 220 (H) 100 - 199 mg/dL Final   LDL Chol Calc (NIH)  Date Value Ref Range Status  12/24/2021 141 (H) 0 - 99 mg/dL Final   HDL  Date Value Ref Range Status  12/24/2021 50 >39 mg/dL Final   Triglycerides  Date Value Ref Range Status  12/24/2021 160 (H) 0 - 149 mg/dL Final         Passed - Patient is not pregnant      Passed - Valid encounter within last 12 months    Recent Outpatient Visits           6 months ago Essential hypertension   [place], [place], [place]   1 year ago Primary hypertension   [place], Vernia Buff, NP   1 year ago Primary hypertension   [place], Vernia Buff, NP   1 year ago [place], Vernia Buff, NP   1 year ago Primary hypertension   [place], Vernia Buff, NP

## 2022-06-25 NOTE — Telephone Encounter (Signed)
Pt is called to schedule appt 2/22

## 2022-06-26 ENCOUNTER — Other Ambulatory Visit: Payer: Self-pay

## 2022-06-29 ENCOUNTER — Other Ambulatory Visit: Payer: Self-pay

## 2022-07-27 ENCOUNTER — Other Ambulatory Visit: Payer: Self-pay | Admitting: Nurse Practitioner

## 2022-07-27 DIAGNOSIS — E785 Hyperlipidemia, unspecified: Secondary | ICD-10-CM

## 2022-07-27 DIAGNOSIS — I10 Essential (primary) hypertension: Secondary | ICD-10-CM

## 2022-07-28 ENCOUNTER — Other Ambulatory Visit: Payer: Self-pay

## 2022-07-28 MED ORDER — ATORVASTATIN CALCIUM 40 MG PO TABS
40.0000 mg | ORAL_TABLET | Freq: Every day | ORAL | 0 refills | Status: DC
  Filled 2022-07-28: qty 17, 17d supply, fill #0

## 2022-07-28 MED ORDER — AMLODIPINE BESYLATE 10 MG PO TABS
10.0000 mg | ORAL_TABLET | Freq: Every day | ORAL | 0 refills | Status: DC
  Filled 2022-07-28: qty 17, 17d supply, fill #0

## 2022-07-28 NOTE — Telephone Encounter (Signed)
Pt has upcoming appt 08/13/22 filled with enough med to get to appt  Requested Prescriptions  Pending Prescriptions Disp Refills   atorvastatin (LIPITOR) 40 MG tablet 17 tablet 0    Sig: Take 1 tablet (40 mg total) by mouth daily.     Cardiovascular:  Antilipid - Statins Failed - 07/27/2022  5:34 PM      Failed - Lipid Panel in normal range within the last 12 months    Cholesterol, Total  Date Value Ref Range Status  12/24/2021 220 (H) 100 - 199 mg/dL Final   LDL Chol Calc (NIH)  Date Value Ref Range Status  12/24/2021 141 (H) 0 - 99 mg/dL Final   HDL  Date Value Ref Range Status  12/24/2021 50 >39 mg/dL Final   Triglycerides  Date Value Ref Range Status  12/24/2021 160 (H) 0 - 149 mg/dL Final         Passed - Patient is not pregnant      Passed - Valid encounter within last 12 months    Recent Outpatient Visits           7 months ago Essential hypertension   [place], [place]   1 year ago Primary hypertension   [place], Vernia Buff, NP   1 year ago Primary hypertension   [place], Vernia Buff, NP   1 year ago [place], Vernia Buff, NP   1 year ago Primary hypertension   [place], Vernia Buff, NP       Future Appointments             In 2 weeks Gildardo Pounds, NP [place]             amLODipine (NORVASC) 10 MG tablet 17 tablet 0    Sig: Take 1 tablet (10 mg total) by mouth daily.     Cardiovascular: Calcium Channel Blockers 2 Failed - 07/27/2022  5:34 PM      Failed - Valid encounter within last 6 months    Recent Outpatient Visits           7 months ago Essential hypertension   [place], [place]   1 year ago Primary hypertension   [place] [facility] [place], Vernia Buff, NP   1 year ago Primary hypertension   [place], Vernia Buff, NP   1 year ago [place] Gildardo Pounds, NP   1 year ago Primary hypertension   [place] Gildardo Pounds, NP       Future Appointments             In 2 weeks Gildardo Pounds, NP [place] BP in normal range    BP Readings from Last 1 Encounters:  12/24/21 138/84         Passed - Last Heart Rate in normal range    Pulse Readings from Last 1 Encounters:  12/24/21 65

## 2022-07-29 ENCOUNTER — Other Ambulatory Visit: Payer: Self-pay

## 2022-08-13 ENCOUNTER — Encounter: Payer: Self-pay | Admitting: Nurse Practitioner

## 2022-08-13 ENCOUNTER — Other Ambulatory Visit: Payer: Self-pay | Admitting: Family Medicine

## 2022-08-13 ENCOUNTER — Other Ambulatory Visit: Payer: Self-pay

## 2022-08-13 ENCOUNTER — Other Ambulatory Visit: Payer: Self-pay | Admitting: Nurse Practitioner

## 2022-08-13 ENCOUNTER — Ambulatory Visit: Payer: BLUE CROSS/BLUE SHIELD | Attending: Nurse Practitioner | Admitting: Nurse Practitioner

## 2022-08-13 VITALS — BP 145/88 | HR 79 | Temp 98.2°F | Ht 65.5 in | Wt 202.0 lb

## 2022-08-13 DIAGNOSIS — R7303 Prediabetes: Secondary | ICD-10-CM | POA: Diagnosis not present

## 2022-08-13 DIAGNOSIS — S68119S Complete traumatic metacarpophalangeal amputation of unspecified finger, sequela: Secondary | ICD-10-CM

## 2022-08-13 DIAGNOSIS — D649 Anemia, unspecified: Secondary | ICD-10-CM | POA: Diagnosis not present

## 2022-08-13 DIAGNOSIS — E7800 Pure hypercholesterolemia, unspecified: Secondary | ICD-10-CM | POA: Diagnosis not present

## 2022-08-13 DIAGNOSIS — E785 Hyperlipidemia, unspecified: Secondary | ICD-10-CM

## 2022-08-13 DIAGNOSIS — B182 Chronic viral hepatitis C: Secondary | ICD-10-CM

## 2022-08-13 DIAGNOSIS — I10 Essential (primary) hypertension: Secondary | ICD-10-CM

## 2022-08-13 MED ORDER — AMLODIPINE BESYLATE 10 MG PO TABS
10.0000 mg | ORAL_TABLET | Freq: Every day | ORAL | 1 refills | Status: DC
  Filled 2022-08-13: qty 90, 90d supply, fill #0

## 2022-08-13 MED ORDER — ATORVASTATIN CALCIUM 40 MG PO TABS
40.0000 mg | ORAL_TABLET | Freq: Every day | ORAL | 1 refills | Status: DC
  Filled 2022-08-13: qty 90, 90d supply, fill #0

## 2022-08-13 MED ORDER — FLUTICASONE PROPIONATE 50 MCG/ACT NA SUSP
2.0000 | Freq: Every day | NASAL | 0 refills | Status: DC
  Filled 2022-08-13: qty 16, 30d supply, fill #0

## 2022-08-13 NOTE — Telephone Encounter (Signed)
Requested Prescriptions  Pending Prescriptions Disp Refills   fluticasone (FLONASE) 50 MCG/ACT nasal spray 16 g 0    Sig: Place 2 sprays into both nostrils daily.     Ear, Nose, and Throat: Nasal Preparations - Corticosteroids Passed - 08/13/2022  5:31 PM      Passed - Valid encounter within last 12 months    Recent Outpatient Visits           Today Primary hypertension   [place], Vernia Buff, NP   7 months ago Essential hypertension   [place], Dionne Bucy, [place]   1 year ago Primary hypertension   [place], Vernia Buff, NP   1 year ago Primary hypertension   [place], Vernia Buff, NP   1 year ago [place] Gildardo Pounds, NP       Future Appointments             In 3 months Gildardo Pounds, NP [place]

## 2022-08-13 NOTE — Progress Notes (Signed)
Assessment & Plan:  Autumn Gomez was seen today for hypertension.  Diagnoses and all orders for this visit:  Primary hypertension -     CMP14+EGFR -     Hemoglobin A1c -     amLODipine (NORVASC) 10 MG tablet; Take 1 tablet (10 mg total) by mouth daily. -     hydrochlorothiazide (HYDRODIURIL) 25 MG tablet; Take 1 tablet (25 mg total) by mouth daily.  Hypercholesterolemia -     Lipid panel  Prediabetes -     Hemoglobin A1c  Mild anemia -     CBC with Differential    Patient has been counseled on age-appropriate routine health concerns for screening and prevention. These are reviewed and up-to-date. Referrals have been placed accordingly. Immunizations are up-to-date or declined.    Subjective:   Chief Complaint  Patient presents with   Hypertension   HPI Autumn Gomez 57 y.o. female presents to office today for follow up to HTN  She has a past medical history of Abnormal Pap smear, Anemia, Hyperlipidemia, and Hypertension.    HTN Taking amlodipine 10 mg daily.  Blood pressure not well controlled. We discussed dietary and weight modifications. Will add HCTZ today as BP still not at goal. Weight is also trending up.  BP Readings from Last 3 Encounters:  08/13/22 (!) 145/88  12/24/21 138/84  06/25/21 (!) 145/88    Prediabetes Well controlled at this time without the use of any oral diabetic medications Lab Results  Component Value Date   HGBA1C 6.1 (H) 08/13/2022    Review of Systems  Constitutional:  Negative for fever, malaise/fatigue and weight loss.  HENT: Negative.  Negative for nosebleeds.   Eyes: Negative.  Negative for blurred vision, double vision and photophobia.  Respiratory: Negative.  Negative for cough and shortness of breath.   Cardiovascular: Negative.  Negative for chest pain, palpitations and leg swelling.  Gastrointestinal: Negative.  Negative for heartburn, nausea and vomiting.  Musculoskeletal: Negative.  Negative for myalgias.  Neurological:  Negative.  Negative for dizziness, focal weakness, seizures and headaches.  Psychiatric/Behavioral: Negative.  Negative for suicidal ideas.     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   Outpatient Medications Prior to Visit  Medication Sig Dispense Refill   triamcinolone cream (KENALOG) 0.1 % Apply 1 Application topically 2 (two) times daily. 30 g 0   amLODipine (NORVASC) 10 MG tablet Take 1 tablet (10 mg total) by mouth daily. 17 tablet 0   atorvastatin (LIPITOR) 40 MG tablet Take 1 tablet (40 mg total) by mouth daily. 17 tablet 0   benzonatate (TESSALON) 100 MG capsule Take 1  capsule (100 mg total) by mouth 2 (two) times daily as needed for cough. (Patient not taking: Reported on 08/13/2022) 20 capsule 0   cyclobenzaprine (FLEXERIL) 10 MG tablet Take 1 tablet (10 mg total) by mouth at bedtime. (Patient not taking: Reported on 08/13/2022) 30 tablet 1   fluticasone (FLONASE) 50 MCG/ACT nasal spray Place 2 sprays into both nostrils daily. (Patient not taking: Reported on 08/13/2022) 16 g 0   Facility-Administered Medications Prior to Visit  Medication Dose Route Frequency Provider Last Rate Last Admin   0.9 %  sodium chloride infusion  500 mL Intravenous Once [place], MD        Allergies  Allergen Reactions   Keflex [Cephalexin]     Severe rash       Objective:    BP (!)  145/88   Pulse 79   Temp 98.2 F (36.8 C) (Oral)   Ht 5' 5.5" (1.664 m)   Wt 202 lb (91.6 kg)   SpO2 99%   BMI 33.10 kg/m  Wt Readings from Last 3 Encounters:  08/13/22 202 lb (91.6 kg)  12/24/21 218 lb 12.8 oz (99.2 kg)  06/25/21 216 lb 2 oz (98 kg)    Physical Exam Vitals and nursing note reviewed.  Constitutional:      Appearance: She is well-developed.  HENT:     Head: Normocephalic and atraumatic.  Cardiovascular:     Rate and Rhythm: Normal rate and regular rhythm.     Heart sounds: Normal heart sounds. No murmur heard.    No friction rub. No gallop.  Pulmonary:     Effort: Pulmonary effort is normal. No tachypnea or respiratory distress.     Breath sounds: Normal breath sounds. No decreased breath sounds, wheezing, rhonchi or rales.  Chest:     Chest wall: No tenderness.  Abdominal:     General: Bowel sounds are normal.     Palpations: Abdomen is soft.  Musculoskeletal:        General: Normal range of motion.     Cervical back: Normal range of motion.  Skin:    General: Skin is warm and dry.  Neurological:     Mental Status: She is alert and oriented to person, place, and time.     Coordination: Coordination normal.  Psychiatric:        Behavior: Behavior normal. Behavior is cooperative.        Thought Content: Thought content normal.        Judgment: Judgment normal.          Patient has been counseled extensively about nutrition and exercise as well as the importance of adherence with medications and regular follow-up. The patient was given clear instructions to go to ER or return to medical center if symptoms don't improve, worsen or new problems develop. The patient verbalized understanding.   Follow-up: Return in about 3 months (around 11/11/2022) for HTN.   Gildardo Pounds, FNP-BC [facility] and [facility] [place], [place]   08/23/2022, 11:33 AM

## 2022-08-13 NOTE — Telephone Encounter (Signed)
Requested Prescriptions  Pending Prescriptions Disp Refills   atorvastatin (LIPITOR) 40 MG tablet 90 tablet 1    Sig: Take 1 tablet (40 mg total) by mouth daily.     Cardiovascular:  Antilipid - Statins Failed - 08/13/2022  5:31 PM      Failed - Lipid Panel in normal range within the last 12 months    Cholesterol, Total  Date Value Ref Range Status  12/24/2021 220 (H) 100 - 199 mg/dL Final   LDL Chol Calc (NIH)  Date Value Ref Range Status  12/24/2021 141 (H) 0 - 99 mg/dL Final   HDL  Date Value Ref Range Status  12/24/2021 50 >39 mg/dL Final   Triglycerides  Date Value Ref Range Status  12/24/2021 160 (H) 0 - 149 mg/dL Final         Passed - Patient is not pregnant      Passed - Valid encounter within last 12 months    Recent Outpatient Visits           Today Primary hypertension   [place], Vernia Buff, NP   7 months ago Essential hypertension   [place], [place], [place]   1 year ago Primary hypertension   [place], Vernia Buff, NP   1 year ago Primary hypertension   [place], Vernia Buff, NP   1 year ago [place] Gildardo Pounds, NP       Future Appointments             In 3 months Gildardo Pounds, NP [place]

## 2022-08-14 ENCOUNTER — Other Ambulatory Visit: Payer: Self-pay

## 2022-08-14 LAB — CMP14+EGFR
ALT: 19 IU/L (ref 0–32)
AST: 32 IU/L (ref 0–40)
Albumin/Globulin Ratio: 1.7 (ref 1.2–2.2)
Albumin: 4.6 g/dL (ref 3.8–4.9)
Alkaline Phosphatase: 90 IU/L (ref 44–121)
BUN/Creatinine Ratio: 20 (ref 9–23)
BUN: 18 mg/dL (ref 6–24)
Bilirubin Total: <0.2 mg/dL (ref 0.0–1.2)
CO2: 24 mmol/L (ref 20–29)
Calcium: 9.4 mg/dL (ref 8.7–10.2)
Chloride: 103 mmol/L (ref 96–106)
Creatinine, Ser: 0.88 mg/dL (ref 0.57–1.00)
Globulin, Total: 2.7 g/dL (ref 1.5–4.5)
Glucose: 97 mg/dL (ref 70–99)
Potassium: 4.3 mmol/L (ref 3.5–5.2)
Sodium: 141 mmol/L (ref 134–144)
Total Protein: 7.3 g/dL (ref 6.0–8.5)
eGFR: 77 mL/min/{1.73_m2} (ref >59)

## 2022-08-14 LAB — LIPID PANEL
Chol/HDL Ratio: 2.7 ratio (ref 0.0–4.4)
Cholesterol, Total: 173 mg/dL (ref 100–199)
HDL: 63 mg/dL (ref >39)
LDL Chol Calc (NIH): 92 mg/dL (ref 0–99)
Triglycerides: 101 mg/dL (ref 0–149)
VLDL Cholesterol Cal: 18 mg/dL (ref 5–40)

## 2022-08-14 LAB — CBC WITH DIFFERENTIAL/PLATELET
Basophils Absolute: 0 10*3/uL (ref 0.0–0.2)
Basos: 0 %
EOS (ABSOLUTE): 0.1 10*3/uL (ref 0.0–0.4)
Eos: 1 %
Hematocrit: 34.8 % (ref 34.0–46.6)
Hemoglobin: 11.2 g/dL (ref 11.1–15.9)
Immature Grans (Abs): 0 10*3/uL (ref 0.0–0.1)
Immature Granulocytes: 0 %
Lymphocytes Absolute: 2.8 10*3/uL (ref 0.7–3.1)
Lymphs: 34 %
MCH: 29.1 pg (ref 26.6–33.0)
MCHC: 32.2 g/dL (ref 31.5–35.7)
MCV: 90 fL (ref 79–97)
Monocytes Absolute: 0.7 10*3/uL (ref 0.1–0.9)
Monocytes: 9 %
Neutrophils Absolute: 4.6 10*3/uL (ref 1.4–7.0)
Neutrophils: 56 %
Platelets: 244 10*3/uL (ref 150–450)
RBC: 3.85 x10E6/uL (ref 3.77–5.28)
RDW: 12.8 % (ref 11.7–15.4)
WBC: 8.3 10*3/uL (ref 3.4–10.8)

## 2022-08-14 LAB — HEMOGLOBIN A1C
Est. average glucose Bld gHb Est-mCnc: 128 mg/dL
Hgb A1c MFr Bld: 6.1 % — ABNORMAL HIGH (ref 4.8–5.6)

## 2022-08-17 ENCOUNTER — Other Ambulatory Visit: Payer: Self-pay

## 2022-08-18 ENCOUNTER — Ambulatory Visit: Payer: BLUE CROSS/BLUE SHIELD

## 2022-08-23 ENCOUNTER — Encounter: Payer: Self-pay | Admitting: Nurse Practitioner

## 2022-08-23 MED ORDER — HYDROCHLOROTHIAZIDE 25 MG PO TABS
25.0000 mg | ORAL_TABLET | Freq: Every day | ORAL | 3 refills | Status: DC
  Filled 2022-08-23: qty 90, 90d supply, fill #0

## 2022-08-24 ENCOUNTER — Other Ambulatory Visit: Payer: Self-pay

## 2022-08-25 NOTE — Progress Notes (Signed)
Unable to reach patient by phone to relay response. Unable to leave message due to full voicemail box.

## 2022-08-31 ENCOUNTER — Other Ambulatory Visit: Payer: Self-pay

## 2022-10-07 ENCOUNTER — Telehealth: Payer: Self-pay

## 2022-10-07 NOTE — Progress Notes (Signed)
Patient attempted to be outreached by Jacob Goodman, PharmD Candidate on 10/07/2022 to discuss hypertension. Left voicemail for patient to return our call at their convenience at [phone].   Jacob Goodman, PharmD Candidate     Densel Kronick, Pharm.D. PGY-2 Ambulatory Care Pharmacy Resident   

## 2022-11-10 ENCOUNTER — Telehealth: Payer: Self-pay

## 2022-11-10 NOTE — Progress Notes (Signed)
Patient attempted to be outreached by Buddy Duty, PharmD Candidate on 11/10/22 to discuss hypertension.   Kirston Luty, Student-PharmD

## 2022-11-11 ENCOUNTER — Encounter: Payer: Self-pay | Admitting: Nurse Practitioner

## 2022-11-11 ENCOUNTER — Ambulatory Visit: Payer: BLUE CROSS/BLUE SHIELD | Attending: Nurse Practitioner | Admitting: Nurse Practitioner

## 2022-11-11 ENCOUNTER — Other Ambulatory Visit: Payer: Self-pay

## 2022-11-11 VITALS — BP 131/77 | HR 70 | Ht 65.5 in | Wt 200.2 lb

## 2022-11-11 DIAGNOSIS — I10 Essential (primary) hypertension: Secondary | ICD-10-CM | POA: Diagnosis not present

## 2022-11-11 DIAGNOSIS — L209 Atopic dermatitis, unspecified: Secondary | ICD-10-CM

## 2022-11-11 DIAGNOSIS — J3089 Other allergic rhinitis: Secondary | ICD-10-CM

## 2022-11-11 DIAGNOSIS — E785 Hyperlipidemia, unspecified: Secondary | ICD-10-CM

## 2022-11-11 MED ORDER — ATORVASTATIN CALCIUM 40 MG PO TABS
40.0000 mg | ORAL_TABLET | Freq: Every day | ORAL | 1 refills | Status: DC
  Filled 2022-11-11: qty 90, 90d supply, fill #0
  Filled 2023-02-07: qty 90, 90d supply, fill #1

## 2022-11-11 MED ORDER — TRIAMCINOLONE ACETONIDE 0.1 % EX CREA
1.0000 | TOPICAL_CREAM | Freq: Two times a day (BID) | CUTANEOUS | 1 refills | Status: AC
Start: 2022-11-11 — End: ?
  Filled 2022-11-11: qty 60, 30d supply, fill #0

## 2022-11-11 MED ORDER — FLUTICASONE PROPIONATE 50 MCG/ACT NA SUSP
2.0000 | Freq: Every day | NASAL | 1 refills | Status: DC
Start: 2022-11-11 — End: 2023-09-10
  Filled 2022-11-11 – 2023-07-05 (×3): qty 16, 30d supply, fill #0
  Filled 2023-07-17 – 2023-08-10 (×2): qty 16, 30d supply, fill #1

## 2022-11-11 MED ORDER — AMLODIPINE BESYLATE 10 MG PO TABS
10.0000 mg | ORAL_TABLET | Freq: Every day | ORAL | 1 refills | Status: DC
  Filled 2022-11-11: qty 90, 90d supply, fill #0
  Filled 2023-02-07: qty 90, 90d supply, fill #1

## 2022-11-11 NOTE — Progress Notes (Signed)
Assessment & Plan:  Diagnoses and all orders for this visit:  Primary hypertension Continue all antihypertensives as prescribed.  Reminded to bring in blood pressure log for follow  up appointment.  RECOMMENDATIONS: DASH/Mediterranean Diets are healthier choices for HTN.   -     amLODipine (NORVASC) 10 MG tablet; Take 1 tablet (10 mg total) by mouth daily.  Dyslipidemia, goal LDL below 100 INSTRUCTIONS: Work on a low fat, heart healthy diet and participate in regular aerobic exercise program by working out at least 150 minutes per week; 5 days a week-30 minutes per day. Avoid red meat/beef/steak,  fried foods. junk foods, sodas, sugary drinks, unhealthy snacking, alcohol and smoking.  Drink at least 80 oz of water per day and monitor your carbohydrate intake daily.   -     atorvastatin (LIPITOR) 40 MG tablet; Take 1 tablet (40 mg total) by mouth daily.  Allergic rhinitis due to other allergic trigger, unspecified seasonality -     fluticasone (FLONASE) 50 MCG/ACT nasal spray; Place 2 sprays into both nostrils daily.  Atopic dermatitis, unspecified type -     triamcinolone cream (KENALOG) 0.1 %; Apply 1 Application topically 2 (two) times daily.    Patient has been counseled on age-appropriate routine health concerns for screening and prevention. These are reviewed and up-to-date. Referrals have been placed accordingly. Immunizations are up-to-date or declined.    Subjective:   Chief Complaint  Patient presents with   Hypertension   HPI Autumn Gomez 57 y.o. female presents to office today for follow up to HTN.   HTN Taking amlodipine 10 mg daily. We discussed dietary and weight modifications.  Weight is also trending down. She is taking B12. BP Readings from Last 3 Encounters:  11/11/22 131/77  08/13/22 (!) 145/88  12/24/21 138/84    Review of Systems  Constitutional:  Negative for fever, malaise/fatigue and weight loss.  HENT: Negative.  Negative for nosebleeds.    Eyes: Negative.  Negative for blurred vision, double vision and photophobia.  Respiratory: Negative.  Negative for cough and shortness of breath.   Cardiovascular: Negative.  Negative for chest pain, palpitations and leg swelling.  Gastrointestinal: Negative.  Negative for heartburn, nausea and vomiting.  Musculoskeletal: Negative.  Negative for myalgias.  Neurological: Negative.  Negative for dizziness, focal weakness, seizures and headaches.  Endo/Heme/Allergies:  Positive for environmental allergies.  Psychiatric/Behavioral: Negative.  Negative for suicidal ideas.     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   Outpatient Medications Prior to Visit  Medication Sig Dispense Refill   amLODipine (NORVASC) 10 MG tablet Take 1 tablet (10 mg total)  by mouth daily. 90 tablet 1   atorvastatin (LIPITOR) 40 MG tablet Take 1 tablet (40 mg total) by mouth daily. 90 tablet 1   fluticasone (FLONASE) 50 MCG/ACT nasal spray Place 2 sprays into both nostrils daily. 16 g 0   triamcinolone cream (KENALOG) 0.1 % Apply 1 Application topically 2 (two) times daily. 30 g 0   hydrochlorothiazide (HYDRODIURIL) 25 MG tablet Take 1 tablet (25 mg total) by mouth daily. (Patient not taking: Reported on 11/11/2022) 90 tablet 3   Facility-Administered Medications Prior to Visit  Medication Dose Route Frequency Provider  Last Rate Last Admin   0.9 %  sodium chloride infusion  500 mL Intravenous Once Tressia Danas, MD        Allergies  Allergen Reactions   Keflex [Cephalexin]     Severe rash       Objective:    BP 131/77 (BP Location: Left Arm, Patient Position: Sitting, Cuff Size: Normal)   Pulse 70   Ht 5' 5.5" (1.664 m)   Wt 200 lb 3.2 oz (90.8 kg)   SpO2 100%   BMI 32.81 kg/m  Wt Readings from Last 3 Encounters:  11/11/22 200 lb 3.2 oz (90.8 kg)  08/13/22 202 lb (91.6 kg)  12/24/21 218 lb 12.8 oz (99.2 kg)    Physical Exam Vitals and nursing note reviewed.  Constitutional:      Appearance: She is well-developed.  HENT:     Head: Normocephalic and atraumatic.  Cardiovascular:     Rate and Rhythm: Normal rate and regular rhythm.     Heart sounds: Normal heart sounds. No murmur heard.    No friction rub. No gallop.  Pulmonary:     Effort: Pulmonary effort is normal. No tachypnea or respiratory distress.     Breath sounds: Normal breath sounds. No decreased breath sounds, wheezing, rhonchi or rales.  Chest:     Chest wall: No tenderness.  Abdominal:     General: Bowel sounds are normal.     Palpations: Abdomen is soft.  Musculoskeletal:        General: Normal range of motion.     Cervical back: Normal range of motion.  Skin:    General: Skin is warm and dry.  Neurological:     Mental Status: She is alert and oriented to person, place, and time.     Coordination: Coordination normal.  Psychiatric:        Behavior: Behavior normal. Behavior is cooperative.        Thought Content: Thought content normal.        Judgment: Judgment normal.          Patient has been counseled extensively about nutrition and exercise as well as the importance of adherence with medications and regular follow-up. The patient was given clear instructions to go to ER or return to medical center if symptoms don't improve, worsen or new problems develop. The patient verbalized understanding.    Follow-up: Return in about 3 months (around 02/11/2023).   Claiborne Rigg, FNP-BC [facility] and [facility] [place], [place] [phone]   11/11/2022, 4:04 PM

## 2023-01-27 ENCOUNTER — Other Ambulatory Visit: Payer: Self-pay

## 2023-02-08 ENCOUNTER — Other Ambulatory Visit: Payer: Self-pay

## 2023-05-07 ENCOUNTER — Other Ambulatory Visit: Payer: Self-pay | Admitting: Nurse Practitioner

## 2023-05-07 ENCOUNTER — Other Ambulatory Visit: Payer: Self-pay

## 2023-05-07 DIAGNOSIS — E785 Hyperlipidemia, unspecified: Secondary | ICD-10-CM

## 2023-05-07 DIAGNOSIS — I10 Essential (primary) hypertension: Secondary | ICD-10-CM

## 2023-05-07 MED ORDER — AMLODIPINE BESYLATE 10 MG PO TABS
10.0000 mg | ORAL_TABLET | Freq: Every day | ORAL | 0 refills | Status: DC
Start: 2023-05-07 — End: 2023-06-07
  Filled 2023-05-07: qty 30, 30d supply, fill #0

## 2023-05-07 MED ORDER — ATORVASTATIN CALCIUM 40 MG PO TABS
40.0000 mg | ORAL_TABLET | Freq: Every day | ORAL | 1 refills | Status: DC
Start: 2023-05-07 — End: 2023-11-11
  Filled 2023-05-07: qty 30, 30d supply, fill #0
  Filled 2023-06-07: qty 30, 30d supply, fill #1
  Filled 2023-07-05: qty 30, 30d supply, fill #2
  Filled 2023-07-05: qty 30, 30d supply, fill #0
  Filled 2023-07-17 – 2023-08-10 (×2): qty 30, 30d supply, fill #1
  Filled 2023-09-10: qty 30, 30d supply, fill #2
  Filled 2023-10-10: qty 30, 30d supply, fill #3

## 2023-05-11 ENCOUNTER — Other Ambulatory Visit: Payer: Self-pay | Admitting: Nurse Practitioner

## 2023-05-11 DIAGNOSIS — Z1231 Encounter for screening mammogram for malignant neoplasm of breast: Secondary | ICD-10-CM

## 2023-05-14 ENCOUNTER — Ambulatory Visit
Admission: RE | Admit: 2023-05-14 | Discharge: 2023-05-14 | Disposition: A | Discharge status: Home / Self Care | Payer: 59 | Source: Ambulatory Visit | Attending: Nurse Practitioner | Admitting: Nurse Practitioner

## 2023-05-14 DIAGNOSIS — Z1231 Encounter for screening mammogram for malignant neoplasm of breast: Secondary | ICD-10-CM

## 2023-05-18 ENCOUNTER — Other Ambulatory Visit: Payer: Self-pay | Admitting: Nurse Practitioner

## 2023-05-18 DIAGNOSIS — R928 Other abnormal and inconclusive findings on diagnostic imaging of breast: Secondary | ICD-10-CM

## 2023-05-26 ENCOUNTER — Other Ambulatory Visit: Payer: Self-pay | Admitting: Nurse Practitioner

## 2023-05-26 ENCOUNTER — Ambulatory Visit
Admission: RE | Admit: 2023-05-26 | Discharge: 2023-05-26 | Disposition: A | Discharge status: Home / Self Care | Payer: 59 | Source: Ambulatory Visit | Attending: Nurse Practitioner | Admitting: Nurse Practitioner

## 2023-05-26 DIAGNOSIS — R2231 Localized swelling, mass and lump, right upper limb: Secondary | ICD-10-CM | POA: Diagnosis not present

## 2023-05-26 DIAGNOSIS — R928 Other abnormal and inconclusive findings on diagnostic imaging of breast: Secondary | ICD-10-CM

## 2023-06-07 ENCOUNTER — Other Ambulatory Visit: Payer: Self-pay

## 2023-06-07 ENCOUNTER — Other Ambulatory Visit: Payer: Self-pay | Admitting: Nurse Practitioner

## 2023-06-07 DIAGNOSIS — I10 Essential (primary) hypertension: Secondary | ICD-10-CM

## 2023-06-08 ENCOUNTER — Encounter: Payer: Self-pay | Admitting: Pharmacist

## 2023-06-08 ENCOUNTER — Other Ambulatory Visit (HOSPITAL_COMMUNITY): Payer: Self-pay

## 2023-06-08 ENCOUNTER — Other Ambulatory Visit: Payer: Self-pay

## 2023-06-08 MED ORDER — AMLODIPINE BESYLATE 10 MG PO TABS
10.0000 mg | ORAL_TABLET | Freq: Every day | ORAL | 0 refills | Status: DC
  Filled 2023-06-08 (×2): qty 30, 30d supply, fill #0

## 2023-06-11 ENCOUNTER — Other Ambulatory Visit: Payer: Self-pay

## 2023-07-02 ENCOUNTER — Other Ambulatory Visit: Payer: Self-pay

## 2023-07-05 ENCOUNTER — Other Ambulatory Visit: Payer: Self-pay

## 2023-07-05 ENCOUNTER — Other Ambulatory Visit (HOSPITAL_COMMUNITY): Payer: Self-pay

## 2023-07-05 ENCOUNTER — Other Ambulatory Visit: Payer: Self-pay | Admitting: Family Medicine

## 2023-07-05 DIAGNOSIS — I10 Essential (primary) hypertension: Secondary | ICD-10-CM

## 2023-07-05 MED ORDER — AMLODIPINE BESYLATE 10 MG PO TABS
10.0000 mg | ORAL_TABLET | Freq: Every day | ORAL | 0 refills | Status: DC
  Filled 2023-07-05 – 2023-07-06 (×2): qty 7, 7d supply, fill #0

## 2023-07-06 ENCOUNTER — Other Ambulatory Visit: Payer: Self-pay

## 2023-07-06 ENCOUNTER — Other Ambulatory Visit (HOSPITAL_COMMUNITY): Payer: Self-pay

## 2023-07-09 ENCOUNTER — Other Ambulatory Visit: Payer: Self-pay

## 2023-07-09 ENCOUNTER — Ambulatory Visit: Payer: 59 | Attending: Nurse Practitioner | Admitting: Nurse Practitioner

## 2023-07-09 ENCOUNTER — Encounter: Payer: Self-pay | Admitting: Nurse Practitioner

## 2023-07-09 VITALS — BP 155/98 | HR 79 | Resp 20 | Ht 65.5 in | Wt 208.2 lb

## 2023-07-09 DIAGNOSIS — D649 Anemia, unspecified: Secondary | ICD-10-CM | POA: Diagnosis not present

## 2023-07-09 DIAGNOSIS — R7303 Prediabetes: Secondary | ICD-10-CM

## 2023-07-09 DIAGNOSIS — R102 Pelvic and perineal pain: Secondary | ICD-10-CM

## 2023-07-09 DIAGNOSIS — I10 Essential (primary) hypertension: Secondary | ICD-10-CM

## 2023-07-09 DIAGNOSIS — M20002 Unspecified deformity of left finger(s): Secondary | ICD-10-CM

## 2023-07-09 MED ORDER — AMLODIPINE BESYLATE 10 MG PO TABS
10.0000 mg | ORAL_TABLET | Freq: Every day | ORAL | 1 refills | Status: DC
  Filled 2023-07-09: qty 90, 90d supply, fill #0
  Filled 2023-07-17: qty 30, 30d supply, fill #0
  Filled 2023-08-10: qty 30, 30d supply, fill #1
  Filled 2023-09-10: qty 30, 30d supply, fill #2
  Filled 2023-10-10: qty 30, 30d supply, fill #3
  Filled 2023-11-11: qty 30, 30d supply, fill #4
  Filled 2023-12-15: qty 30, 30d supply, fill #5

## 2023-07-09 NOTE — Patient Instructions (Signed)
[facility] [address]  [phone] Open ? Closes 7?PM

## 2023-07-09 NOTE — Progress Notes (Signed)
Deep pain on the right side of abdomen.

## 2023-07-09 NOTE — Progress Notes (Signed)
Assessment & Plan:  Autumn Gomez was seen today for medical management of chronic issues.  Diagnoses and all orders for this visit:  Primary hypertension -     amLODipine (NORVASC) 10 MG tablet; Take 1 tablet (10 mg total) by mouth daily. Please keep office visit for additional refills. -     CMP14+EGFR Continue all antihypertensives as prescribed.  Reminded to bring in blood pressure log for follow  up appointment.  RECOMMENDATIONS: DASH/Mediterranean Diets are healthier choices for HTN.    Pelvic pain -     US PELVIC COMPLETE WITH TRANSVAGINAL; Future  Deformity of joint of thumb, left -     Ambulatory referral to Hand Surgery  Prediabetes -     Hemoglobin A1c  Mild anemia -     CBC with Differential    Patient has been counseled on age-appropriate routine health concerns for screening and prevention. These are reviewed and up-to-date. Referrals have been placed accordingly. Immunizations are up-to-date or declined.    Subjective:   Chief Complaint  Patient presents with   Medical Management of Chronic Issues    Autumn Gomez 58 y.o. female presents to office today for follow up to HTN  She has a past medical history of Abnormal Pap smear, Anemia, Hyperlipidemia, and Hypertension.    Autumn Gomez endorses infrequent lower right pelvic pain. Last episode occurred while she was sweeping. Her last menstrual period was about 4 months ago. She does have a history of fibroids which were never removed.    HTN Blood pressure is elevated today. She is currently prescribed amlodipine.  BP Readings from Last 3 Encounters:  07/09/23 (!) 155/98  11/11/22 131/77  08/13/22 (!) 145/88    She has a bony deformity on the left thumb that she would like surgically corrected.   Prediabetes A1c at goal of less than 6. Diabetes currently controlled with diet.  Lab Results  Component Value Date   HGBA1C 5.8 (H) 07/09/2023    Review of Systems  Constitutional:  Negative for  fever, malaise/fatigue and weight loss.  HENT: Negative.  Negative for nosebleeds.   Eyes: Negative.  Negative for blurred vision, double vision and photophobia.  Respiratory: Negative.  Negative for cough and shortness of breath.   Cardiovascular: Negative.  Negative for chest pain, palpitations and leg swelling.  Gastrointestinal: Negative.  Negative for heartburn, nausea and vomiting.  Genitourinary:        Pelvic pain  Musculoskeletal:  Positive for joint pain. Negative for myalgias.  Neurological: Negative.  Negative for dizziness, focal weakness, seizures and headaches.  Psychiatric/Behavioral: Negative.  Negative for suicidal ideas.     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   Outpatient Medications Prior to Visit  Medication Sig Dispense Refill   atorvastatin (LIPITOR) 40 MG tablet Take 1 tablet (40 mg total) by mouth daily. 90 tablet 1   fluticasone (FLONASE) 50 MCG/ACT nasal spray Place 2 sprays into both nostrils daily. 16 g 1   triamcinolone cream (KENALOG) 0.1 % Apply 1 Application topically 2 (two) times daily. 60 g 1   amLODipine (NORVASC) 10 MG tablet Take 1 tablet (10 mg total) by mouth daily. Please keep office visit for additional refills. 7 tablet 0   Facility-Administered Medications Prior to Visit  Medication Dose Route Frequency Provider Last Rate Last Admin    0.9 %  sodium chloride infusion  500 mL Intravenous Once Tressia Danas, MD        Allergies  Allergen Reactions   Keflex [Cephalexin]     Severe rash       Objective:    BP (!) 155/98 (BP Location: Left Arm, Patient Position: Sitting, Cuff Size: Normal)   Pulse 79   Resp 20   Ht 5' 5.5" (1.664 m)   Wt 208 lb 3.2 oz (94.4 kg)   LMP 02/22/2023 (Approximate)   SpO2 99%   BMI 34.12 kg/m  Wt Readings from Last 3 Encounters:  07/09/23 208 lb 3.2 oz (94.4 kg)  11/11/22 200 lb 3.2 oz (90.8 kg)  08/13/22 202 lb (91.6 kg)    Physical Exam Vitals and nursing note reviewed.  Constitutional:      Appearance: She is well-developed.  HENT:     Head: Normocephalic and atraumatic.  Cardiovascular:     Rate and Rhythm: Normal rate and regular rhythm.     Heart sounds: Normal heart sounds. No murmur heard.    No friction rub. No gallop.  Pulmonary:     Effort: Pulmonary effort is normal. No tachypnea or respiratory distress.     Breath sounds: Normal breath sounds. No decreased breath sounds, wheezing, rhonchi or rales.  Chest:     Chest wall: No tenderness.  Abdominal:     General: Bowel sounds are normal.     Palpations: Abdomen is soft.  Musculoskeletal:        General: Normal range of motion.     Cervical back: Normal range of motion.  Skin:    General: Skin is warm and dry.  Neurological:     Mental Status: She is alert and oriented to person, place, and time.     Coordination: Coordination normal.  Psychiatric:        Behavior: Behavior normal. Behavior is cooperative.        Thought Content: Thought content normal.        Judgment: Judgment normal.          Patient has been counseled extensively about nutrition and exercise as well as the importance of adherence with medications and regular follow-up. The patient was given clear instructions to go to ER or return to medical center if symptoms don't improve, worsen or new problems develop. The patient verbalized  understanding.   Follow-up: Return in about 3 months (around 10/07/2023).   Claiborne Rigg, FNP-BC [facility] [place], [place] [phone]   07/16/2023, 7:42 PM

## 2023-07-10 LAB — CMP14+EGFR
ALT: 16 [IU]/L (ref 0–32)
AST: 30 [IU]/L (ref 0–40)
Albumin: 4.4 g/dL (ref 3.8–4.9)
Alkaline Phosphatase: 92 [IU]/L (ref 44–121)
BUN/Creatinine Ratio: 20 (ref 9–23)
BUN: 18 mg/dL (ref 6–24)
Bilirubin Total: <0.2 mg/dL (ref 0.0–1.2)
CO2: 23 mmol/L (ref 20–29)
Calcium: 9.4 mg/dL (ref 8.7–10.2)
Chloride: 102 mmol/L (ref 96–106)
Creatinine, Ser: 0.92 mg/dL (ref 0.57–1.00)
Globulin, Total: 2.7 g/dL (ref 1.5–4.5)
Glucose: 85 mg/dL (ref 70–99)
Potassium: 4.3 mmol/L (ref 3.5–5.2)
Sodium: 139 mmol/L (ref 134–144)
Total Protein: 7.1 g/dL (ref 6.0–8.5)
eGFR: 73 mL/min/{1.73_m2} (ref >59)

## 2023-07-10 LAB — CBC WITH DIFFERENTIAL/PLATELET
Basophils Absolute: 0 10*3/uL (ref 0.0–0.2)
Basos: 0 %
EOS (ABSOLUTE): 0 10*3/uL (ref 0.0–0.4)
Eos: 1 %
Hematocrit: 36.3 % (ref 34.0–46.6)
Hemoglobin: 11.5 g/dL (ref 11.1–15.9)
Immature Grans (Abs): 0 10*3/uL (ref 0.0–0.1)
Immature Granulocytes: 0 %
Lymphocytes Absolute: 2.3 10*3/uL (ref 0.7–3.1)
Lymphs: 33 %
MCH: 29.4 pg (ref 26.6–33.0)
MCHC: 31.7 g/dL (ref 31.5–35.7)
MCV: 93 fL (ref 79–97)
Monocytes Absolute: 1 10*3/uL — ABNORMAL HIGH (ref 0.1–0.9)
Monocytes: 14 %
Neutrophils Absolute: 3.6 10*3/uL (ref 1.4–7.0)
Neutrophils: 52 %
Platelets: 219 10*3/uL (ref 150–450)
RBC: 3.91 x10E6/uL (ref 3.77–5.28)
RDW: 12.9 % (ref 11.7–15.4)
WBC: 6.9 10*3/uL (ref 3.4–10.8)

## 2023-07-10 LAB — HEMOGLOBIN A1C
Est. average glucose Bld gHb Est-mCnc: 120 mg/dL
Hgb A1c MFr Bld: 5.8 % — ABNORMAL HIGH (ref 4.8–5.6)

## 2023-07-12 ENCOUNTER — Other Ambulatory Visit: Payer: Self-pay

## 2023-07-12 ENCOUNTER — Encounter: Payer: Self-pay | Admitting: Nurse Practitioner

## 2023-07-14 ENCOUNTER — Ambulatory Visit
Admission: RE | Admit: 2023-07-14 | Discharge: 2023-07-14 | Disposition: A | Discharge status: Home / Self Care | Payer: 59 | Source: Ambulatory Visit | Attending: Nurse Practitioner | Admitting: Nurse Practitioner

## 2023-07-14 DIAGNOSIS — D259 Leiomyoma of uterus, unspecified: Secondary | ICD-10-CM | POA: Diagnosis not present

## 2023-07-14 DIAGNOSIS — R102 Pelvic and perineal pain: Secondary | ICD-10-CM

## 2023-07-16 ENCOUNTER — Encounter: Payer: Self-pay | Admitting: Nurse Practitioner

## 2023-07-18 ENCOUNTER — Other Ambulatory Visit (HOSPITAL_COMMUNITY): Payer: Self-pay

## 2023-07-19 ENCOUNTER — Other Ambulatory Visit (HOSPITAL_COMMUNITY): Payer: Self-pay

## 2023-07-22 ENCOUNTER — Other Ambulatory Visit: Payer: Self-pay | Admitting: Nurse Practitioner

## 2023-07-22 DIAGNOSIS — D219 Benign neoplasm of connective and other soft tissue, unspecified: Secondary | ICD-10-CM

## 2023-08-10 ENCOUNTER — Other Ambulatory Visit (HOSPITAL_COMMUNITY): Payer: Self-pay

## 2023-08-23 ENCOUNTER — Ambulatory Visit: Payer: 59 | Admitting: Orthopedic Surgery

## 2023-09-10 ENCOUNTER — Other Ambulatory Visit: Payer: Self-pay

## 2023-09-10 ENCOUNTER — Other Ambulatory Visit: Payer: Self-pay | Admitting: Nurse Practitioner

## 2023-09-10 DIAGNOSIS — J3089 Other allergic rhinitis: Secondary | ICD-10-CM

## 2023-09-10 MED ORDER — FLUTICASONE PROPIONATE 50 MCG/ACT NA SUSP
2.0000 | Freq: Every day | NASAL | 1 refills | Status: DC
  Filled 2023-09-10: qty 16, 30d supply, fill #0
  Filled 2023-10-10: qty 16, 30d supply, fill #1

## 2023-09-13 ENCOUNTER — Encounter: Payer: Self-pay | Admitting: Obstetrics and Gynecology

## 2023-09-13 ENCOUNTER — Other Ambulatory Visit: Payer: Self-pay

## 2023-09-15 ENCOUNTER — Ambulatory Visit (INDEPENDENT_AMBULATORY_CARE_PROVIDER_SITE_OTHER): Payer: 59 | Admitting: Orthopedic Surgery

## 2023-09-15 ENCOUNTER — Other Ambulatory Visit (INDEPENDENT_AMBULATORY_CARE_PROVIDER_SITE_OTHER): Payer: Self-pay

## 2023-09-15 DIAGNOSIS — M79645 Pain in left finger(s): Secondary | ICD-10-CM | POA: Diagnosis not present

## 2023-09-15 DIAGNOSIS — M19049 Primary osteoarthritis, unspecified hand: Secondary | ICD-10-CM | POA: Diagnosis not present

## 2023-09-15 DIAGNOSIS — G8929 Other chronic pain: Secondary | ICD-10-CM | POA: Diagnosis not present

## 2023-09-15 NOTE — Progress Notes (Signed)
 Autumn Gomez - 58 y.o. female MRN [id number]  Date of birth: Aug 14, 1965  Office Visit Note: Visit Date: 09/15/2023 PCP: Claiborne Rigg, NP Referred by: Claiborne Rigg, NP  Subjective: No chief complaint on file.  HPI: Autumn Gomez is a pleasant 58 y.o. female who presents today for evaluation of ongoing pain and deformity at the left thumb IP joint.  She has a history notable for prior injuries to the index and long finger with partial amputation of the index finger, approximately 10 years prior.  She is unsure if she is sustained a significant injury to the left thumb IP joint.  She has noted progressive deformity over the past year or so with associated swelling and intermittent pain.  Has not tried any formalized treatments outside of over-the-counter anti-inflammatory medicine.  Pertinent ROS were reviewed with the patient and found to be negative unless otherwise specified above in HPI.   Visit Reason: left hand/ thumb IP joint arthritis with deformity Duration of symptoms: years Hand dominance: right Occupation: Childcare Diabetic: No Smoking: No- Marijuana only Heart/Lung History:  none Blood Thinners: none  Prior Testing/EMG: none Injections (Date):none Treatments: none Prior Surgery: none  Assessment & Plan: Visit Diagnoses:  1. Hand arthritis   2. Chronic pain of left thumb     Plan: Extensive discussion was had with the patient today regarding her left thumb IP joint pain and deformity.  She has significant arthritis at the IP joint with asymmetric joint space narrowing and radial deviation of the distal phalanx.  She is unsure about any specific traumatic history, however this does appear to be potentially posttraumatic in nature given the aggressive arthritic nature of this joints in comparison to the remainder of the hand.  Given the severity of the arthritis in this region, I did explain that in the future she may be an appropriate candidate  for IP joint fusion in order to create appropriate thumb stability and for pain control.  However, at this juncture, given that she has not trialed any conservative treatment, I did show her compressive Coban wrapping today at the IP joint which she liked.  She would like to continue this for the time being and will return to me in the future for repeat discussion regarding potential IP joint fusion should her symptoms remain refractory to the conservative care.  I did explain that she can continue utilizing overall anti-inflammatory medication both topical and oral as needed.  Will return as needed.  I spent 45 minutes in the care of this patient today including review of previous documentation, imaging obtained, face-to-face time discussing all options regarding treatment and documenting the encounter.   Follow-up: No follow-ups on file.   Meds & Orders: No orders of the defined types were placed in this encounter.   Orders Placed This Encounter  Procedures   XR Hand Complete Left     Procedures: No procedures performed      Clinical History: No specialty comments available.  She reports that she has quit smoking. She has never used smokeless tobacco.  Recent Labs    07/09/23 1657  HGBA1C 5.8*    Objective:   Vital Signs: There were no vitals taken for this visit.  Physical Exam  Gen: Well-appearing, in no acute distress; non-toxic CV: Regular Rate. Well-perfused. Warm.  Resp: Breathing unlabored on room air; no wheezing. Psych: Fluid speech in conversation; appropriate affect; normal thought process  Ortho Exam Left hand: Thumb - Notable  swelling and deformity at the IP joint, radially deviated distal phalanx, associated tenderness at the IP joint - Range of motion IP joint 0-30 - Sensation is intact distally, thumb remains warm well-perfused  Imaging: XR Hand Complete Left Result Date: 09/15/2023 X-rays of the left hand, multiple views were obtained today with focus on  the left thumb IP joint X-rays of the left thumb IP joint demonstrate significant joint space narrowing, asymmetric in nature with radial deviated positioning of the distal phalanx.  There is associated osteophyte formation as well, with what appears to be a displaced osteophyte just proximal to the IP joint.   Past Medical/Family/Surgical/Social History: Medications & Allergies reviewed per EMR, new medications updated. 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 09/07/2016   Amputation of finger of left hand 09/07/2016   Mild anemia 12/17/2014   Vitamin D deficiency 12/17/2014   Essential hypertension 12/12/2014   Tinea corporis 12/12/2014   Allergic rhinitis 12/12/2014   Fibroid uterus 12/06/2013   Obesity (BMI 30-39.9) 10/09/2013   Past Medical History:  Diagnosis Date   Abnormal Pap smear    repeat pap   Anemia    as child    Hyperlipidemia    Hypertension    Family History  Problem Relation Age of Onset   Cancer Father        lung   Hypertension Father    Hypertension Mother    Breast cancer Maternal Aunt    Breast cancer Cousin    Colon cancer Neg Hx    Esophageal cancer Neg Hx    Stomach cancer Neg Hx    Rectal cancer Neg Hx    Past Surgical History:  Procedure Laterality Date   BREAST EXCISIONAL BIOPSY Left    finger amputee Left 2013    1st finger    left breast cyst  Left 1986   Social History   Occupational History   Occupation: Early Childhood education    Employer: [employer]    Comment: part time, loader, manual work   Tobacco Use   Smoking status: Former   Smokeless tobacco: Never   Tobacco comments:    smokes black and milds on the weekends   Vaping Use   Vaping status: Never Used  Substance and Sexual Activity   Alcohol use: Yes    Comment: socially     Drug use: Yes    Types: Marijuana    Comment: occasional use   Sexual activity: Not Currently    Birth control/protection: None    Autumn Gomez, M.D. [facility], Hand Surgery

## 2023-10-11 ENCOUNTER — Other Ambulatory Visit (HOSPITAL_COMMUNITY): Payer: Self-pay

## 2023-10-14 ENCOUNTER — Other Ambulatory Visit (HOSPITAL_COMMUNITY)
Admission: RE | Admit: 2023-10-14 | Discharge: 2023-10-14 | Disposition: A | Discharge status: Home / Self Care | Payer: Self-pay | Source: Ambulatory Visit | Attending: Obstetrics and Gynecology | Admitting: Obstetrics and Gynecology

## 2023-10-14 ENCOUNTER — Encounter: Payer: Self-pay | Admitting: Obstetrics and Gynecology

## 2023-10-14 ENCOUNTER — Ambulatory Visit (INDEPENDENT_AMBULATORY_CARE_PROVIDER_SITE_OTHER): Payer: Self-pay | Admitting: Obstetrics and Gynecology

## 2023-10-14 VITALS — BP 136/82 | HR 73 | Ht 65.0 in | Wt 210.9 lb

## 2023-10-14 DIAGNOSIS — Z01419 Encounter for gynecological examination (general) (routine) without abnormal findings: Secondary | ICD-10-CM | POA: Insufficient documentation

## 2023-10-14 DIAGNOSIS — N950 Postmenopausal bleeding: Secondary | ICD-10-CM | POA: Insufficient documentation

## 2023-10-14 DIAGNOSIS — N838 Other noninflammatory disorders of ovary, fallopian tube and broad ligament: Secondary | ICD-10-CM

## 2023-10-14 NOTE — Progress Notes (Signed)
 Subjective:     Autumn Gomez is a 58 y.o. female P0 postmenopausal x 2 years and BMI 35 who is here for a comprehensive physical exam. The patient reports no problems. Patient reports a 4-day episode of postmenopausal vaginal bleeding in August 2024 and in the fall a few days of vaginal spotting. Patient is not sexually active. She denies pelvic pain or abnormal discharge. She denies urinary incontinence. She reports some occasional constipation. Patient is current on colonoscopy and mammogram  Past Medical History:  Diagnosis Date   Abnormal Pap smear    repeat pap   Anemia    as child    Hyperlipidemia    Hypertension    Past Surgical History:  Procedure Laterality Date   BREAST EXCISIONAL BIOPSY Left    finger amputee Left 2013    1st finger    left breast cyst  Left 1986   Family History  Problem Relation Age of Onset   Cancer Father        lung   Hypertension Father    Hypertension Mother    Breast cancer Maternal Aunt    Breast cancer Cousin    Colon cancer Neg Hx    Esophageal cancer Neg Hx    Stomach cancer Neg Hx    Rectal cancer Neg Hx     Social History   Socioeconomic History   Marital status: Single    Spouse name: Not on file   Number of children: 0   Years of education: college    Highest education level: Associate degree: academic program  Occupational History   Occupation: Early Childhood education    Employer: [employer]    Comment: part time, loader, manual work   Tobacco Use   Smoking status: Former   Smokeless tobacco: Never   Tobacco comments:    smokes black and milds on the weekends   Vaping Use   Vaping status: Never Used  Substance and Sexual Activity   Alcohol use: Yes    Comment: socially    Drug use: Yes    Types: Marijuana    Comment: occasional use   Sexual activity: Not Currently    Birth control/protection: None  Other Topics Concern   Not on file  Social History Narrative   Lives alone   2 god babies    Mom in  [place], [place]. , mom 67 in 2016    Social Drivers of Health   Financial Resource Strain: High Risk (07/05/2023)   Overall Financial Resource Strain (CARDIA)    Difficulty of Paying Living Expenses: Hard  Food Insecurity: Food Insecurity Present (07/05/2023)   Hunger Vital Sign    Worried About Running Out of Food in the Last Year: Often true    Ran Out of Food in the Last Year: Often true  Transportation Needs: No Transportation Needs (07/05/2023)   PRAPARE - Administrator, Civil Service (Medical): No    Lack of Transportation (Non-Medical): No  Physical Activity: Sufficiently Active (07/05/2023)   Exercise Vital Sign    Days of Exercise per Week: 5 days    Minutes of Exercise per Session: 60 min  Stress: No Stress Concern Present (07/05/2023)   [employer] of Occupational Health - Occupational Stress Questionnaire    Feeling of Stress : Only a little  Social Connections: Moderately Isolated (07/05/2023)   Social Connection and Isolation Panel [NHANES]    Frequency of Communication with Friends and Family: More than three times a week  Frequency of Social Gatherings with Friends and Family: Three times a week    Attends Religious Services: More than 4 times per year    Active Member of Clubs or Organizations: No    Attends Banker Meetings: Not on file    Marital Status: Never married  Intimate Partner Violence: Not on file   Health Maintenance  Topic Date Due   Zoster Vaccines- Shingrix (1 of 2) Never done   COVID-19 Vaccine (4 - 2024-25 season) 02/21/2023   INFLUENZA VACCINE  01/21/2024   DTaP/Tdap/Td (2 - Td or Tdap) 12/11/2024   Cervical Cancer Screening (HPV/Pap Cotest)  01/16/2025   MAMMOGRAM  05/13/2025   Colonoscopy  09/13/2026   Hepatitis C Screening  Completed   HIV Screening  Completed   HPV VACCINES  Aged Out   Meningococcal B Vaccine  Aged Out       Review of Systems Pertinent items noted in HPI and remainder of comprehensive ROS  otherwise negative.   Objective:  Blood pressure 136/82, pulse 73, height 5\' 5"  (1.651 m), weight 210 lb 14.4 oz (95.7 kg).   GENERAL: Well-developed, well-nourished female in no acute distress.  HEENT: Normocephalic, atraumatic. Sclerae anicteric.  NECK: Supple. Normal thyroid .  LUNGS: Clear to auscultation bilaterally.  HEART: Regular rate and rhythm. BREASTS: Symmetric in size. No palpable masses or lymphadenopathy, skin changes, or nipple drainage. ABDOMEN: Soft, nontender, nondistended. No organomegaly. PELVIC: Normal external female genitalia. Vagina is pink and rugated.  Normal discharge. Normal appearing cervix. Uterus is normal in size. No adnexal mass or tenderness. Chaperone present during the pelvic exam EXTREMITIES: No cyanosis, clubbing, or edema, 2+ distal pulses.    06/2023 ultrasound  PROCEDURE: US  PELVIS COMPLETE WITH TRANSVAGINAL   HISTORY: Patient is a 58 y/o F with lower pelvic pain for one month. History of fibroids.   COMPARISON: U/S pelvis 10/18/2013.   TECHNIQUE: Two-dimensional transabdominal grayscale and color Doppler ultrasound of the pelvis was performed. Transvaginal was also performed.   FINDINGS: The uterus is in anteverted in position and measures 14.2 x 9.0 x 8.6 cm. It demonstrates a heterogeneous echotexture with multiple fibroids. The largest fibroid, anterior subserosal, contains calcifications and measures 7.6 x 7.3 cm. The endometrium is obscured by overlying fibroids.   The right ovary measures 4.6 x 2.7 x 2.4 cm and demonstrates a complex cyst with tiny echogenic foci measuring 3.3 x 2.0 cm. There is a second cystic lesion measuring approximately 2.3 x 1.7 cm. This lesion contains a hyperechoic solid lesion measuring 1.3 x 1.1 cm. There is normal color Doppler flow.   The left ovary is not visualized.   There is no fluid present within the cul-de-sac.   IMPRESSION: 1. Enlarged, heterogeneous uterus with multiple fibroids.  The endometrium is obscured by overlying fibroids.   2. The right ovary contains two complex cystic lesions as described above. Gynecological consultation and pelvic MR may be helpful for further evaluation.   3.  Nonvisualization of the left ovary.   Thank you for allowing us  to assist in the care of this patient.     Electronically Signed   By: Beula Brunswick M.D.   On: 07/19/2023 20:06 Assessment:    Healthy female exam.      Plan:    Pap smear collected Pelvic MRI ordered to evaluate complex adnexal mass Ca-125 ordered Discussed endometrial biopsy to evaluate postmenopausal vaginal bleeding ENDOMETRIAL BIOPSY     The indications for endometrial biopsy were reviewed.   Risks of  the biopsy including cramping, bleeding, infection, uterine perforation, inadequate specimen and need for additional procedures  were discussed. The patient states she understands and agrees to undergo procedure today. Consent was signed. Time out was performed. Urine HCG was negative. A sterile speculum was placed in the patient's vagina and the cervix was prepped with Betadine. A single-toothed tenaculum was placed on the anterior lip of the cervix to stabilize it. The uterine cavity was sounded to a depth of 10 cm using the uterine sound. The 3 mm pipelle was introduced into the endometrial cavity without difficulty, 2 passes were made.  A  moderate amount of tissue was  sent to pathology. The instruments were removed from the patient's vagina. Minimal bleeding from the cervix was noted. The patient tolerated the procedure well.  Routine post-procedure instructions were given to the patient. The patient will follow up in two weeks to review the results and for further management.   Patient will be contacted with results and further management options See After Visit Summary for Counseling Recommendations

## 2023-10-15 LAB — CA 125: Cancer Antigen (CA) 125: 4.7 U/mL (ref 0.0–38.1)

## 2023-10-18 LAB — SURGICAL PATHOLOGY

## 2023-10-19 LAB — CYTOLOGY - PAP
Adequacy: ABSENT
Comment: NEGATIVE
Comment: NEGATIVE
Comment: NEGATIVE
Diagnosis: UNDETERMINED — AB
HPV 16: NEGATIVE
HPV 18 / 45: NEGATIVE
High risk HPV: POSITIVE — AB

## 2023-11-11 ENCOUNTER — Other Ambulatory Visit: Payer: Self-pay | Admitting: Nurse Practitioner

## 2023-11-11 DIAGNOSIS — J3089 Other allergic rhinitis: Secondary | ICD-10-CM

## 2023-11-11 DIAGNOSIS — E785 Hyperlipidemia, unspecified: Secondary | ICD-10-CM

## 2023-11-12 ENCOUNTER — Other Ambulatory Visit (HOSPITAL_COMMUNITY): Payer: Self-pay

## 2023-11-12 ENCOUNTER — Other Ambulatory Visit: Payer: Self-pay

## 2023-11-12 MED ORDER — FLUTICASONE PROPIONATE 50 MCG/ACT NA SUSP
2.0000 | Freq: Every day | NASAL | 0 refills | Status: DC
  Filled 2023-11-12: qty 16, 30d supply, fill #0

## 2023-11-12 MED ORDER — ATORVASTATIN CALCIUM 40 MG PO TABS
40.0000 mg | ORAL_TABLET | Freq: Every day | ORAL | 0 refills | Status: DC
  Filled 2023-11-12: qty 30, 30d supply, fill #0

## 2023-11-19 ENCOUNTER — Ambulatory Visit (HOSPITAL_BASED_OUTPATIENT_CLINIC_OR_DEPARTMENT_OTHER)
Admission: RE | Admit: 2023-11-19 | Discharge: 2023-11-19 | Disposition: A | Discharge status: Home / Self Care | Payer: Self-pay | Source: Ambulatory Visit | Attending: Obstetrics and Gynecology | Admitting: Obstetrics and Gynecology

## 2023-11-19 DIAGNOSIS — N838 Other noninflammatory disorders of ovary, fallopian tube and broad ligament: Secondary | ICD-10-CM | POA: Diagnosis present

## 2023-11-19 MED ORDER — GADOBUTROL 1 MMOL/ML IV SOLN
10.0000 mL | Freq: Once | INTRAVENOUS | Status: AC | PRN
  Administered 2023-11-19: 10 mL via INTRAVENOUS
  Filled 2023-11-19: qty 10

## 2023-11-22 ENCOUNTER — Ambulatory Visit: Payer: Self-pay | Admitting: Obstetrics and Gynecology

## 2023-12-14 ENCOUNTER — Encounter: Payer: Self-pay | Admitting: Obstetrics and Gynecology

## 2023-12-14 ENCOUNTER — Ambulatory Visit: Admitting: Obstetrics and Gynecology

## 2023-12-14 VITALS — BP 148/87 | HR 73 | Wt 207.0 lb

## 2023-12-14 DIAGNOSIS — Z712 Person consulting for explanation of examination or test findings: Secondary | ICD-10-CM | POA: Diagnosis not present

## 2023-12-14 DIAGNOSIS — D259 Leiomyoma of uterus, unspecified: Secondary | ICD-10-CM | POA: Diagnosis not present

## 2023-12-14 NOTE — Progress Notes (Signed)
 58 yo P0 here to discuss test results. Patient had a follow up MRI to evaluate fibroid uterus and a right ovarian cyst. Patient denies any episodes of vagina bleeding and reports abdominal pain has completely resolved. She is without any complaints  Past Medical History:  Diagnosis Date   Abnormal Pap smear    repeat pap   Anemia    as child    Hyperlipidemia    Hypertension    Past Surgical History:  Procedure Laterality Date   BREAST EXCISIONAL BIOPSY Left    finger amputee Left 2013    1st finger    left breast cyst  Left 1986   Family History  Problem Relation Age of Onset   Cancer Father        lung   Hypertension Father    Hypertension Mother    Breast cancer Maternal Aunt    Breast cancer Cousin    Colon cancer Neg Hx    Esophageal cancer Neg Hx    Stomach cancer Neg Hx    Rectal cancer Neg Hx    Social History   Tobacco Use   Smoking status: Former   Smokeless tobacco: Never   Tobacco comments:    smokes black and milds on the weekends   Vaping Use   Vaping status: Never Used  Substance Use Topics   Alcohol use: Yes    Comment: socially    Drug use: Yes    Types: Marijuana    Comment: occasional use   ROS See pertinent in HPI. All other systems reviewed and non contributory Blood pressure (!) 148/87, pulse 73, weight 207 lb (93.9 kg).  GENERAL: Well-developed, well-nourished female in no acute distress.  NEURO: alert and oriented x 3  MR PELVIS W WO CONTRAST Result Date: 11/20/2023 CLINICAL DATA:  Pelvic pain, adnexal mass suspected EXAM: MRI PELVIS WITHOUT AND WITH CONTRAST TECHNIQUE: Multiplanar multisequence MR imaging of the pelvis was performed both before and after administration of intravenous contrast. CONTRAST:  10mL GADAVIST  GADOBUTROL  1 MMOL/ML IV SOLN COMPARISON:  Pelvic ultrasound, 07/14/2023 FINDINGS: Urinary Tract:  No abnormality visualized. Bowel:  Unremarkable visualized pelvic bowel loops. Vascular/Lymphatic: No pathologically enlarged  lymph nodes. No significant vascular abnormality seen. Reproductive: Bulky fibroid uterus, overall dimensions at least 19.0 x 10.8 x 12.4 cm. The endometrial cavity is almost completely effaced with no normal endometrium visible. Large, dominant fibroid of the posterior uterine body measures 8.4 x 6.3 x 7.2 cm (series 3, image 28, series 6, image 21). Notably there are additional large exophytic or pedunculated fibroids of the uterine fundus (series 2, image 8, series 6, image 16). The right ovary is enlarged by a simple appearing cyst measuring 2.3 x 1.9 cm (series 5, image 26). No solid mass or suspicious contrast enhancement. Normal postmenopausal left ovary (series 3, image 9). Other:  None. Musculoskeletal: No suspicious bone lesions identified. IMPRESSION: 1. Bulky fibroid uterus, overall dimensions at least 19.0 x 10.8 x 12.4 cm. The endometrial cavity is almost completely effaced with no normal endometrium visible. Large, dominant fibroid of the posterior uterine body measures 8.4 x 6.3 x 7.2 cm. Notably there are additional large exophytic or pedunculated fibroids of the uterine fundus. 2. The right ovary is enlarged by a simple appearing cyst measuring 2.3 x 1.9 cm. No solid mass or suspicious contrast enhancement. No specific further follow-up or characterization is required for this benign appearing cyst. 3. Normal postmenopausal left ovary. Electronically Signed   By: Marolyn JONETTA Marlyce CHRISTELLA.D.  On: 11/20/2023 12:27      A/P 58 yo with enlarged fibroid uterus - pelvic ultrasound and MRI reviewed with the patient - reviewed normal CA-125 and benign endometrial biopsy - discussed options of expectant management or hysterectomy. Patient desires to discuss with her family members and is leaning towards expectant management - RTC in 1 year for annual exam and repeat pap smear

## 2023-12-14 NOTE — Progress Notes (Signed)
 Pt is in office for f/u to discuss MRI results.  Pt would like to discuss treatment options.

## 2023-12-15 ENCOUNTER — Other Ambulatory Visit (HOSPITAL_COMMUNITY): Payer: Self-pay

## 2023-12-15 ENCOUNTER — Other Ambulatory Visit: Payer: Self-pay | Admitting: Nurse Practitioner

## 2023-12-15 DIAGNOSIS — E785 Hyperlipidemia, unspecified: Secondary | ICD-10-CM

## 2023-12-15 DIAGNOSIS — J3089 Other allergic rhinitis: Secondary | ICD-10-CM

## 2023-12-16 ENCOUNTER — Other Ambulatory Visit (HOSPITAL_COMMUNITY): Payer: Self-pay

## 2023-12-16 ENCOUNTER — Other Ambulatory Visit: Payer: Self-pay

## 2023-12-16 MED ORDER — ATORVASTATIN CALCIUM 40 MG PO TABS
40.0000 mg | ORAL_TABLET | Freq: Every day | ORAL | 0 refills | Status: DC
  Filled 2023-12-16: qty 30, 30d supply, fill #0

## 2023-12-16 MED ORDER — FLUTICASONE PROPIONATE 50 MCG/ACT NA SUSP
2.0000 | Freq: Every day | NASAL | 0 refills | Status: DC
  Filled 2023-12-16: qty 16, 30d supply, fill #0

## 2023-12-17 ENCOUNTER — Other Ambulatory Visit: Payer: Self-pay

## 2023-12-17 ENCOUNTER — Other Ambulatory Visit (HOSPITAL_COMMUNITY): Payer: Self-pay

## 2024-01-12 ENCOUNTER — Other Ambulatory Visit: Payer: Self-pay | Admitting: Family Medicine

## 2024-01-12 ENCOUNTER — Other Ambulatory Visit (HOSPITAL_COMMUNITY): Payer: Self-pay

## 2024-01-12 ENCOUNTER — Other Ambulatory Visit: Payer: Self-pay | Admitting: Nurse Practitioner

## 2024-01-12 ENCOUNTER — Other Ambulatory Visit: Payer: Self-pay

## 2024-01-12 DIAGNOSIS — I10 Essential (primary) hypertension: Secondary | ICD-10-CM

## 2024-01-12 DIAGNOSIS — J3089 Other allergic rhinitis: Secondary | ICD-10-CM

## 2024-01-12 DIAGNOSIS — E785 Hyperlipidemia, unspecified: Secondary | ICD-10-CM

## 2024-01-12 MED ORDER — AMLODIPINE BESYLATE 10 MG PO TABS
10.0000 mg | ORAL_TABLET | Freq: Every day | ORAL | 1 refills | Status: DC
  Filled 2024-01-12: qty 90, 90d supply, fill #0
  Filled 2024-02-14 – 2024-02-15 (×2): qty 90, 90d supply, fill #1

## 2024-01-12 MED ORDER — FLUTICASONE PROPIONATE 50 MCG/ACT NA SUSP
2.0000 | Freq: Every day | NASAL | 0 refills | Status: DC
  Filled 2024-01-12: qty 16, 30d supply, fill #0

## 2024-01-12 MED ORDER — ATORVASTATIN CALCIUM 40 MG PO TABS
40.0000 mg | ORAL_TABLET | Freq: Every day | ORAL | 0 refills | Status: DC
  Filled 2024-01-12: qty 30, 30d supply, fill #0

## 2024-02-14 ENCOUNTER — Other Ambulatory Visit: Payer: Self-pay | Admitting: Nurse Practitioner

## 2024-02-14 ENCOUNTER — Other Ambulatory Visit (HOSPITAL_COMMUNITY): Payer: Self-pay

## 2024-02-14 DIAGNOSIS — E785 Hyperlipidemia, unspecified: Secondary | ICD-10-CM

## 2024-02-14 DIAGNOSIS — J3089 Other allergic rhinitis: Secondary | ICD-10-CM

## 2024-02-15 ENCOUNTER — Encounter (HOSPITAL_COMMUNITY): Payer: Self-pay

## 2024-02-15 ENCOUNTER — Other Ambulatory Visit (HOSPITAL_COMMUNITY): Payer: Self-pay

## 2024-02-15 ENCOUNTER — Other Ambulatory Visit: Payer: Self-pay | Admitting: Nurse Practitioner

## 2024-02-15 DIAGNOSIS — J3089 Other allergic rhinitis: Secondary | ICD-10-CM

## 2024-02-15 DIAGNOSIS — E785 Hyperlipidemia, unspecified: Secondary | ICD-10-CM

## 2024-02-15 NOTE — Telephone Encounter (Signed)
 Unable to refill per protocol, courtesy refill already given, OV needed.  Requested Prescriptions  Pending Prescriptions Disp Refills   atorvastatin  (LIPITOR) 40 MG tablet 30 tablet 0    Sig: Take 1 tablet (40 mg total) by mouth daily. Please schedule PCP appointment for more refills.     Cardiovascular:  Antilipid - Statins Failed - 02/15/2024  3:49 PM      Failed - Lipid Panel in normal range within the last 12 months    Cholesterol, Total  Date Value Ref Range Status  08/13/2022 173 100 - 199 mg/dL Final   LDL Chol Calc (NIH)  Date Value Ref Range Status  08/13/2022 92 0 - 99 mg/dL Final   HDL  Date Value Ref Range Status  08/13/2022 63 >39 mg/dL Final   Triglycerides  Date Value Ref Range Status  08/13/2022 101 0 - 149 mg/dL Final         Passed - Patient is not pregnant      Passed - Valid encounter within last 12 months    Recent Outpatient Visits           7 months ago Primary hypertension   [facility] [place]. [facility] Theotis Haze ORN, NP   1 year ago Primary hypertension   [facility] [place] [facility] - [facility] Dept Of [place]. [facility] Theotis Haze ORN, NP   1 year ago Primary hypertension   [facility] [place] - [facility] Dept Of [place]. [facility] Theotis Haze ORN, NP   2 years ago Essential hypertension   [facility] [place] - [facility] Of [place]. [facility] [place], Jon HERO, [place]   2 years ago Primary hypertension   [facility] - [facility] Dept Of [place]. [facility] [place], [place] W, NP               fluticasone  (FLONASE ) 50 MCG/ACT nasal spray 16 g 0    Sig: Place 2 sprays into both nostrils daily. Please schedule PCP appointment for more refills.     Ear, Nose, and Throat: Nasal Preparations - Corticosteroids Passed - 02/15/2024  3:49 PM      Passed - Valid encounter within last 12 months    Recent Outpatient Visits            7 months ago Primary hypertension   [facility] [place] - [facility] Dept Of [place]. [facility] Theotis Haze ORN, NP   1 year ago Primary hypertension   [facility] [place] - [facility] Dept Of [place]. [facility] Theotis Haze ORN, NP   1 year ago Primary hypertension   [facility] [place] - [facility] Dept Of [place]. [facility] Theotis Haze ORN, NP   2 years ago Essential hypertension   [facility] - [facility] Dept Of [place]. [facility] [place], Jon HERO, [place]   2 years ago Primary hypertension   [facility] [place] - [facility] Dept Of [place]. [facility] Theotis Haze ORN, [place]

## 2024-02-17 ENCOUNTER — Encounter

## 2024-02-17 ENCOUNTER — Encounter (HOSPITAL_COMMUNITY): Payer: Self-pay

## 2024-02-17 ENCOUNTER — Other Ambulatory Visit (HOSPITAL_COMMUNITY): Payer: Self-pay

## 2024-02-17 DIAGNOSIS — Z1231 Encounter for screening mammogram for malignant neoplasm of breast: Secondary | ICD-10-CM

## 2024-02-22 ENCOUNTER — Other Ambulatory Visit (HOSPITAL_COMMUNITY): Payer: Self-pay

## 2024-03-02 ENCOUNTER — Ambulatory Visit

## 2024-03-02 DIAGNOSIS — Z1231 Encounter for screening mammogram for malignant neoplasm of breast: Secondary | ICD-10-CM

## 2024-03-14 ENCOUNTER — Ambulatory Visit: Attending: Nurse Practitioner | Admitting: Nurse Practitioner

## 2024-03-14 ENCOUNTER — Encounter: Payer: Self-pay | Admitting: Nurse Practitioner

## 2024-03-14 ENCOUNTER — Other Ambulatory Visit: Payer: Self-pay

## 2024-03-14 ENCOUNTER — Other Ambulatory Visit (HOSPITAL_COMMUNITY): Payer: Self-pay

## 2024-03-14 VITALS — BP 132/84 | HR 61 | Resp 19 | Ht 65.0 in | Wt 212.8 lb

## 2024-03-14 DIAGNOSIS — Z1231 Encounter for screening mammogram for malignant neoplasm of breast: Secondary | ICD-10-CM

## 2024-03-14 DIAGNOSIS — Z8249 Family history of ischemic heart disease and other diseases of the circulatory system: Secondary | ICD-10-CM

## 2024-03-14 DIAGNOSIS — J3089 Other allergic rhinitis: Secondary | ICD-10-CM | POA: Diagnosis not present

## 2024-03-14 DIAGNOSIS — E785 Hyperlipidemia, unspecified: Secondary | ICD-10-CM | POA: Diagnosis not present

## 2024-03-14 DIAGNOSIS — Z23 Encounter for immunization: Secondary | ICD-10-CM

## 2024-03-14 DIAGNOSIS — Z139 Encounter for screening, unspecified: Secondary | ICD-10-CM

## 2024-03-14 DIAGNOSIS — R7303 Prediabetes: Secondary | ICD-10-CM | POA: Diagnosis not present

## 2024-03-14 DIAGNOSIS — I10 Essential (primary) hypertension: Secondary | ICD-10-CM

## 2024-03-14 DIAGNOSIS — Z79899 Other long term (current) drug therapy: Secondary | ICD-10-CM

## 2024-03-14 MED ORDER — ATORVASTATIN CALCIUM 40 MG PO TABS
40.0000 mg | ORAL_TABLET | Freq: Every day | ORAL | 1 refills | Status: DC
  Filled 2024-03-14 (×2): qty 90, 90d supply, fill #0
  Filled 2024-04-12 – 2024-05-30 (×2): qty 90, 90d supply, fill #1

## 2024-03-14 MED ORDER — HYDROCHLOROTHIAZIDE 25 MG PO TABS
25.0000 mg | ORAL_TABLET | Freq: Every day | ORAL | 3 refills | Status: AC
  Filled 2024-03-14 – 2024-04-14 (×3): qty 90, 90d supply, fill #0
  Filled 2024-07-05: qty 90, 90d supply, fill #1

## 2024-03-14 MED ORDER — AMLODIPINE BESYLATE 10 MG PO TABS
10.0000 mg | ORAL_TABLET | Freq: Every day | ORAL | 1 refills | Status: AC
  Filled 2024-03-14 – 2024-04-14 (×3): qty 90, 90d supply, fill #0
  Filled 2024-07-05: qty 90, 90d supply, fill #1

## 2024-03-14 MED ORDER — COVID-19 MRNA VAC-TRIS(PFIZER) 30 MCG/0.3ML IM SUSY
0.3000 mL | PREFILLED_SYRINGE | Freq: Once | INTRAMUSCULAR | 0 refills | Status: AC
  Filled 2024-03-14: qty 0.3, 1d supply, fill #0

## 2024-03-14 MED ORDER — FLUTICASONE PROPIONATE 50 MCG/ACT NA SUSP
2.0000 | Freq: Every day | NASAL | 1 refills | Status: DC
  Filled 2024-03-14 (×2): qty 16, 30d supply, fill #0
  Filled 2024-04-12: qty 16, 30d supply, fill #1
  Filled 2024-04-14: qty 16, 30d supply, fill #0

## 2024-03-14 NOTE — Patient Instructions (Addendum)
 [facility] [address], [place], [place] [address] Phone: [phone]

## 2024-03-14 NOTE — Progress Notes (Signed)
 Assessment & Plan:  Autumn Gomez was seen today for hypertension.  Diagnoses and all orders for this visit:  Primary hypertension -     CMP14+EGFR -     amLODipine  (NORVASC ) 10 MG tablet; Take 1 tablet (10 mg total) by mouth daily. -     hydrochlorothiazide  (HYDRODIURIL ) 25 MG tablet; Take 1 tablet (25 mg total) by mouth daily. Persistent hypertension with recent missed dose of amlodipine . - Recheck blood pressure before leaving. - Initiated hydrochlorothiazide   Dyslipidemia, goal LDL below 100 -     atorvastatin  (LIPITOR) 40 MG tablet; Take 1 tablet (40 mg total) by mouth daily. -     Lipid panel  Allergic rhinitis due to other allergic trigger, unspecified seasonality -     fluticasone  (FLONASE ) 50 MCG/ACT nasal spray; Place 2 sprays into both nostrils daily.  Need for influenza vaccination -     Flu vaccine trivalent PF, 6mos and older(Flulaval,Afluria,Fluarix,Fluzone)  Encounter for screening involving social determinants of health (SDoH) -     AMB Referral VBCI Care Management  Prediabetes -     Hemoglobin A1c Managed with diet Lab Results  Component Value Date   HGBA1C 5.8 (H) 07/09/2023     Breast cancer screening by mammogram -     MS 3D SCR MAMMO BILAT BR (aka MM); Future  COVID-19 vaccine administered -     COVID-19 mRNA vaccine, Pfizer, (COMIRNATY) syringe; Inject 0.3 mLs into the muscle once for 1 dose.    Patient has been counseled on age-appropriate routine health concerns for screening and prevention. These are reviewed and up-to-date. Referrals have been placed accordingly. Immunizations are up-to-date or declined.    Subjective:   Chief Complaint  Patient presents with   Hypertension    History of Present Illness Autumn Gomez is a 58 year old female with hypertension who presents for a follow-up regarding her blood pressure management.  She has a past medical history of Abnormal Pap smear, Anemia, Hyperlipidemia, and Hypertension.     HTN She has been experiencing intermittent episodes elevated blood pressure readings and has been taking amlodipine  regularly for the past week, although she missed doses for about three days over a week ago. Her blood pressure was also high in June and April. She notes that rushing to the appointment may have affected her blood pressure reading today. BP Readings from Last 3 Encounters:  03/14/24 132/84  12/14/23 (!) 148/87  10/14/23 136/82     Her mother has rheumatoid arthritis and recently underwent surgery for vertebrae issues, impacting her mobility. Her mother is currently in a facility close to her home due to falling spells.  She is due for a mammogram and requesting prescription for COVID vaccine booster.     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   Outpatient Medications Prior to Visit  Medication Sig Dispense Refill   triamcinolone  cream (KENALOG ) 0.1 % Apply 1 Application topically 2 (two) times daily. 60 g 1   amLODipine  (NORVASC ) 10 MG tablet Take 1 tablet (10 mg total) by mouth daily. Please keep office visit for additional refills. 90 tablet 1   atorvastatin  (LIPITOR) 40 MG tablet Take 1 tablet (40 mg total) by mouth daily. Please schedule PCP appointment for more refills. (Patient not taking: Reported on 03/14/2024) 30 tablet 0   fluticasone  (FLONASE ) 50  MCG/ACT nasal spray Place 2 sprays into both nostrils daily. Please schedule PCP appointment for more refills. (Patient not taking: Reported on 03/14/2024) 16 g 0   Facility-Administered Medications Prior to Visit  Medication Dose Route Frequency Provider Last Rate Last Admin   0.9 %  sodium chloride  infusion  500 mL Intravenous Once Beavers, Kimberly, MD        Allergies  Allergen Reactions   Keflex [Cephalexin]     Severe rash       Objective:    BP 132/84 (BP Location: Left Arm, Patient Position: Sitting, Cuff Size: Normal)   Pulse 61   Resp 19   Ht 5' 5 (1.651 m)   Wt 212 lb 12.8 oz (96.5 kg)   SpO2 98%   BMI 35.41 kg/m  Wt Readings from Last 3 Encounters:  03/14/24 212 lb 12.8 oz (96.5 kg)  12/14/23 207 lb (93.9 kg)  10/14/23 210 lb 14.4 oz (95.7 kg)    Physical Exam       Patient has been counseled extensively about nutrition and exercise as well as the importance of adherence with medications and regular follow-up. The patient was given clear instructions to go to ER or return to medical center if symptoms don't improve, worsen or new problems develop. The patient verbalized understanding.   Follow-up: Return in about 3 months (around 06/13/2024).   Haze LELON Servant, FNP-BC [facility] [place], [place] [phone]   03/14/2024, 9:08 PM

## 2024-03-15 LAB — LIPID PANEL
Chol/HDL Ratio: 3.6 ratio (ref 0.0–4.4)
Cholesterol, Total: 242 mg/dL — ABNORMAL HIGH (ref 100–199)
HDL: 67 mg/dL (ref >39)
LDL Chol Calc (NIH): 157 mg/dL — ABNORMAL HIGH (ref 0–99)
Triglycerides: 105 mg/dL (ref 0–149)
VLDL Cholesterol Cal: 18 mg/dL (ref 5–40)

## 2024-03-15 LAB — CMP14+EGFR
ALT: 17 IU/L (ref 0–32)
AST: 29 IU/L (ref 0–40)
Albumin: 4.5 g/dL (ref 3.8–4.9)
Alkaline Phosphatase: 86 IU/L (ref 49–135)
BUN/Creatinine Ratio: 18 (ref 9–23)
BUN: 16 mg/dL (ref 6–24)
Bilirubin Total: <0.2 mg/dL (ref 0.0–1.2)
CO2: 22 mmol/L (ref 20–29)
Calcium: 9.7 mg/dL (ref 8.7–10.2)
Chloride: 101 mmol/L (ref 96–106)
Creatinine, Ser: 0.9 mg/dL (ref 0.57–1.00)
Globulin, Total: 3.1 g/dL (ref 1.5–4.5)
Glucose: 89 mg/dL (ref 70–99)
Potassium: 4 mmol/L (ref 3.5–5.2)
Sodium: 139 mmol/L (ref 134–144)
Total Protein: 7.6 g/dL (ref 6.0–8.5)
eGFR: 75 mL/min/1.73 (ref >59)

## 2024-03-15 LAB — HEMOGLOBIN A1C
Est. average glucose Bld gHb Est-mCnc: 123 mg/dL
Hgb A1c MFr Bld: 5.9 % — ABNORMAL HIGH (ref 4.8–5.6)

## 2024-03-20 ENCOUNTER — Encounter: Payer: Self-pay | Admitting: Nurse Practitioner

## 2024-03-23 ENCOUNTER — Ambulatory Visit: Payer: Self-pay | Admitting: Nurse Practitioner

## 2024-03-30 ENCOUNTER — Other Ambulatory Visit: Payer: Self-pay

## 2024-03-30 MED ORDER — COVID-19 MRNA VAC-TRIS(PFIZER) 30 MCG/0.3ML IM SUSY
0.3000 mL | PREFILLED_SYRINGE | Freq: Once | INTRAMUSCULAR | 0 refills | Status: AC
Start: 2024-03-30 — End: 2024-03-31
  Filled 2024-03-30: qty 0.3, 1d supply, fill #0

## 2024-04-12 ENCOUNTER — Other Ambulatory Visit: Payer: Self-pay

## 2024-04-14 ENCOUNTER — Other Ambulatory Visit (HOSPITAL_COMMUNITY): Payer: Self-pay

## 2024-04-14 ENCOUNTER — Other Ambulatory Visit: Payer: Self-pay

## 2024-04-15 ENCOUNTER — Other Ambulatory Visit (HOSPITAL_COMMUNITY): Payer: Self-pay

## 2024-04-17 ENCOUNTER — Other Ambulatory Visit (HOSPITAL_COMMUNITY): Payer: Self-pay

## 2024-04-17 ENCOUNTER — Other Ambulatory Visit: Payer: Self-pay

## 2024-04-18 ENCOUNTER — Other Ambulatory Visit (HOSPITAL_COMMUNITY): Payer: Self-pay

## 2024-04-18 ENCOUNTER — Other Ambulatory Visit: Payer: Self-pay

## 2024-04-24 ENCOUNTER — Encounter: Payer: Self-pay | Admitting: Radiology

## 2024-05-01 ENCOUNTER — Other Ambulatory Visit (HOSPITAL_COMMUNITY): Payer: Self-pay

## 2024-05-19 ENCOUNTER — Other Ambulatory Visit (HOSPITAL_COMMUNITY): Payer: Self-pay

## 2024-05-19 ENCOUNTER — Other Ambulatory Visit (HOSPITAL_BASED_OUTPATIENT_CLINIC_OR_DEPARTMENT_OTHER): Payer: Self-pay

## 2024-05-23 ENCOUNTER — Telehealth: Payer: Self-pay | Admitting: Nurse Practitioner

## 2024-05-23 NOTE — Telephone Encounter (Signed)
 Due to a change in our office schedule, your appointment must be changed. We must reach you to reschedule this appointment on 06/13/2024 (if the pt calls back please resch appt)

## 2024-05-26 ENCOUNTER — Other Ambulatory Visit: Payer: Self-pay | Admitting: Nurse Practitioner

## 2024-05-26 DIAGNOSIS — Z1231 Encounter for screening mammogram for malignant neoplasm of breast: Secondary | ICD-10-CM

## 2024-05-30 ENCOUNTER — Other Ambulatory Visit: Payer: Self-pay | Admitting: Nurse Practitioner

## 2024-05-30 ENCOUNTER — Other Ambulatory Visit (HOSPITAL_COMMUNITY): Payer: Self-pay

## 2024-05-30 ENCOUNTER — Other Ambulatory Visit: Payer: Self-pay

## 2024-05-30 DIAGNOSIS — J3089 Other allergic rhinitis: Secondary | ICD-10-CM

## 2024-05-30 MED ORDER — FLUTICASONE PROPIONATE 50 MCG/ACT NA SUSP
2.0000 | Freq: Every day | NASAL | 1 refills | Status: AC
  Filled 2024-05-30: qty 16, 30d supply, fill #0
  Filled 2024-07-05: qty 16, 30d supply, fill #1

## 2024-06-13 ENCOUNTER — Ambulatory Visit: Admitting: Nurse Practitioner

## 2024-06-21 ENCOUNTER — Ambulatory Visit
Admission: RE | Admit: 2024-06-21 | Discharge: 2024-06-21 | Disposition: A | Discharge status: Home / Self Care | Source: Ambulatory Visit

## 2024-06-21 DIAGNOSIS — Z1231 Encounter for screening mammogram for malignant neoplasm of breast: Secondary | ICD-10-CM

## 2024-07-04 ENCOUNTER — Telehealth: Payer: Self-pay | Admitting: Nurse Practitioner

## 2024-07-04 NOTE — Telephone Encounter (Signed)
 Pt confirmed appt

## 2024-07-05 ENCOUNTER — Other Ambulatory Visit: Payer: Self-pay | Admitting: Nurse Practitioner

## 2024-07-05 ENCOUNTER — Other Ambulatory Visit: Payer: Self-pay

## 2024-07-05 ENCOUNTER — Ambulatory Visit: Attending: Nurse Practitioner | Admitting: Nurse Practitioner

## 2024-07-05 ENCOUNTER — Other Ambulatory Visit (HOSPITAL_COMMUNITY): Payer: Self-pay

## 2024-07-05 VITALS — BP 134/79 | HR 73 | Ht 65.0 in | Wt 210.0 lb

## 2024-07-05 DIAGNOSIS — R7989 Other specified abnormal findings of blood chemistry: Secondary | ICD-10-CM | POA: Diagnosis not present

## 2024-07-05 DIAGNOSIS — K047 Periapical abscess without sinus: Secondary | ICD-10-CM

## 2024-07-05 DIAGNOSIS — R7303 Prediabetes: Secondary | ICD-10-CM | POA: Diagnosis not present

## 2024-07-05 DIAGNOSIS — I10 Essential (primary) hypertension: Secondary | ICD-10-CM

## 2024-07-05 DIAGNOSIS — E785 Hyperlipidemia, unspecified: Secondary | ICD-10-CM

## 2024-07-05 MED ORDER — ATORVASTATIN CALCIUM 40 MG PO TABS
40.0000 mg | ORAL_TABLET | Freq: Every day | ORAL | 1 refills | Status: AC
  Filled 2024-07-05: qty 90, 90d supply, fill #0

## 2024-07-05 MED ORDER — CLINDAMYCIN HCL 300 MG PO CAPS
300.0000 mg | ORAL_CAPSULE | Freq: Three times a day (TID) | ORAL | 0 refills | Status: AC
  Filled 2024-07-05: qty 21, 7d supply, fill #0

## 2024-07-05 MED ORDER — NAPROXEN 500 MG PO TABS
500.0000 mg | ORAL_TABLET | Freq: Two times a day (BID) | ORAL | 0 refills | Status: AC
  Filled 2024-07-05 (×2): qty 60, 30d supply, fill #0

## 2024-07-05 NOTE — Progress Notes (Signed)
 "  Assessment & Plan:  Autumn Gomez was seen today for hypertension.  Diagnoses and all orders for this visit:  Primary hypertension -     CMP14+EGFR Continue all antihypertensives as prescribed.  Reminded to bring in blood pressure log for follow  up appointment.  RECOMMENDATIONS: DASH/Mediterranean Diets are healthier choices for HTN.    Dental infection -     clindamycin  (CLEOCIN ) 300 MG capsule; Take 1 capsule (300 mg total) by mouth 3 (three) times daily for 7 days. -     naproxen  (NAPROSYN ) 500 MG tablet; Take 1 tablet (500 mg total) by mouth 2 (two) times daily with a meal. Follow up with Dentist as soon as possible.    Prediabetes -     CMP14+EGFR -     Hemoglobin A1c  Abnormal CBC -     CBC with Differential    Patient has been counseled on age-appropriate routine health concerns for screening and prevention. These are reviewed and up-to-date. Referrals have been placed accordingly. Immunizations are up-to-date or declined.    Subjective:   Chief Complaint  Patient presents with   Hypertension    Autumn Gomez 59 y.o. female presents to office today for follow up to HTN an dental problem  She has a past medical history of Abnormal Pap smear, Anemia, Hyperlipidemia, and Hypertension.    HTN Blood pressure is well controlled. She is takint amlodipine  and hydrochlorothiazide  as prescribed.  BP Readings from Last 3 Encounters:  07/05/24 134/79  03/14/24 132/84  12/14/23 (!) 148/87     She experiences facial pain, which she attributes to a cracked tooth. She also has been grinding her teeth, which she associates with stress and nerves. The pain is located in her face and has recently become sore. She suspects the tooth may be infected because the pain has worsened along with swollen red gumline below the tooth   Prediabetes A1c at goal. Managed with diet.  Lab Results  Component Value Date   HGBA1C 6.0 (H) 07/05/2024    Review of Systems  Constitutional:   Negative for fever, malaise/fatigue and weight loss.  HENT:  Negative for nosebleeds.        DENTAL PAIN  Eyes: Negative.  Negative for blurred vision, double vision and photophobia.  Respiratory: Negative.  Negative for cough and shortness of breath.   Cardiovascular: Negative.  Negative for chest pain, palpitations and leg swelling.  Gastrointestinal: Negative.  Negative for heartburn, nausea and vomiting.  Musculoskeletal: Negative.  Negative for myalgias.  Neurological: Negative.  Negative for dizziness, focal weakness, seizures and headaches.  Psychiatric/Behavioral: Negative.  Negative for suicidal ideas.     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   Outpatient Medications Prior to Visit  Medication Sig Dispense Refill   amLODipine  (NORVASC ) 10 MG tablet Take 1 tablet (10 mg total) by mouth daily. 90 tablet 1   atorvastatin  (LIPITOR) 40 MG tablet Take 1 tablet (40 mg total) by mouth daily. 90 tablet 1   fluticasone  (FLONASE ) 50 MCG/ACT nasal spray Place 2 sprays into both nostrils daily. 16 g 1   hydrochlorothiazide  (HYDRODIURIL ) 25 MG tablet Take 1 tablet (25 mg total) by mouth daily. 90 tablet 3   triamcinolone  cream (KENALOG ) 0.1 % Apply 1 Application topically 2 (two) times daily. 60 g 1   Facility-Administered Medications Prior to Visit  Medication Dose Route Frequency  Provider Last Rate Last Admin   0.9 %  sodium chloride  infusion  500 mL Intravenous Once Beavers, Kimberly, MD        Allergies[1]     Objective:    BP 134/79 (BP Location: Left Arm, Patient Position: Sitting, Cuff Size: Normal)   Pulse 73   Ht 5' 5 (1.651 m)   Wt 210 lb (95.3 kg)   SpO2 98%   BMI 34.95 kg/m  Wt Readings from Last 3 Encounters:  07/05/24 210 lb (95.3 kg)  03/14/24 212 lb 12.8 oz (96.5 kg)  12/14/23 207 lb (93.9 kg)    Physical Exam Vitals and nursing note reviewed.  Constitutional:      Appearance: She is well-developed.  HENT:     Head: Normocephalic and atraumatic.     Mouth/Throat:     Dentition: Abnormal dentition. Gingival swelling present.  Cardiovascular:     Rate and Rhythm: Normal rate and regular rhythm.     Heart sounds: Normal heart sounds. No murmur heard.    No friction rub. No gallop.  Pulmonary:     Effort: Pulmonary effort is normal. No tachypnea or respiratory distress.     Breath sounds: Normal breath sounds. No decreased breath sounds, wheezing, rhonchi or rales.  Chest:     Chest wall: No tenderness.  Abdominal:     General: Bowel sounds are normal.     Palpations: Abdomen is soft.  Musculoskeletal:        General: Normal range of motion.     Cervical back: Normal range of motion.  Skin:    General: Skin is warm and dry.  Neurological:     Mental Status: She is alert and oriented to person, place, and time.     Coordination: Coordination normal.  Psychiatric:        Behavior: Behavior normal. Behavior is cooperative.        Thought Content: Thought content normal.        Judgment: Judgment normal.          Patient has been counseled extensively about nutrition and exercise as well as the importance of adherence with medications and regular follow-up. The patient was given clear instructions to go to ER or return to medical center if symptoms don't improve, worsen or new problems develop. The patient verbalized  understanding.   Follow-up: Return in about 4 months (around 11/02/2024).   Autumn LELON Servant, FNP-BC [facility] [place], [place] [phone]   07/23/2024, 9:03 PM     [1]  Allergies Allergen Reactions   Keflex [Cephalexin]     Severe rash   "

## 2024-07-06 ENCOUNTER — Other Ambulatory Visit (HOSPITAL_COMMUNITY): Payer: Self-pay

## 2024-07-06 LAB — HEMOGLOBIN A1C
Est. average glucose Bld gHb Est-mCnc: 126 mg/dL
Hgb A1c MFr Bld: 6 % — ABNORMAL HIGH (ref 4.8–5.6)

## 2024-07-06 LAB — CMP14+EGFR
ALT: 18 IU/L (ref 0–32)
AST: 32 IU/L (ref 0–40)
Albumin: 4.5 g/dL (ref 3.8–4.9)
Alkaline Phosphatase: 88 IU/L (ref 49–135)
BUN/Creatinine Ratio: 20 (ref 9–23)
BUN: 23 mg/dL (ref 6–24)
Bilirubin Total: <0.2 mg/dL (ref 0.0–1.2)
CO2: 25 mmol/L (ref 20–29)
Calcium: 9.6 mg/dL (ref 8.7–10.2)
Chloride: 102 mmol/L (ref 96–106)
Creatinine, Ser: 1.14 mg/dL — ABNORMAL HIGH (ref 0.57–1.00)
Globulin, Total: 2.8 g/dL (ref 1.5–4.5)
Glucose: 102 mg/dL — ABNORMAL HIGH (ref 70–99)
Potassium: 3.9 mmol/L (ref 3.5–5.2)
Sodium: 141 mmol/L (ref 134–144)
Total Protein: 7.3 g/dL (ref 6.0–8.5)
eGFR: 56 mL/min/1.73 — ABNORMAL LOW

## 2024-07-06 LAB — CBC WITH DIFFERENTIAL/PLATELET
Basophils Absolute: 0 x10E3/uL (ref 0.0–0.2)
Basos: 0 %
EOS (ABSOLUTE): 0.1 x10E3/uL (ref 0.0–0.4)
Eos: 1 %
Hematocrit: 35.4 % (ref 34.0–46.6)
Hemoglobin: 11.2 g/dL (ref 11.1–15.9)
Immature Grans (Abs): 0 x10E3/uL (ref 0.0–0.1)
Immature Granulocytes: 0 %
Lymphocytes Absolute: 2.7 x10E3/uL (ref 0.7–3.1)
Lymphs: 29 %
MCH: 29.2 pg (ref 26.6–33.0)
MCHC: 31.6 g/dL (ref 31.5–35.7)
MCV: 92 fL (ref 79–97)
Monocytes Absolute: 0.7 x10E3/uL (ref 0.1–0.9)
Monocytes: 8 %
Neutrophils Absolute: 5.8 x10E3/uL (ref 1.4–7.0)
Neutrophils: 62 %
Platelets: 248 x10E3/uL (ref 150–450)
RBC: 3.83 x10E6/uL (ref 3.77–5.28)
RDW: 13 % (ref 11.7–15.4)
WBC: 9.2 x10E3/uL (ref 3.4–10.8)

## 2024-07-08 ENCOUNTER — Ambulatory Visit: Payer: Self-pay | Admitting: Nurse Practitioner

## 2024-07-23 ENCOUNTER — Encounter: Payer: Self-pay | Admitting: Nurse Practitioner
